# Patient Record
Sex: Female | Born: 1940 | Race: Black or African American | Hispanic: No | Marital: Married | Smoking: Former smoker
Health system: Southern US, Community
[De-identification: ages and names within clinical notes are randomized; demographics above are authoritative.]

## PROBLEM LIST (undated history)

## (undated) DIAGNOSIS — K219 Gastro-esophageal reflux disease without esophagitis: Secondary | ICD-10-CM

## (undated) DIAGNOSIS — J449 Chronic obstructive pulmonary disease, unspecified: Secondary | ICD-10-CM

## (undated) DIAGNOSIS — C801 Malignant (primary) neoplasm, unspecified: Secondary | ICD-10-CM

## (undated) DIAGNOSIS — I10 Essential (primary) hypertension: Secondary | ICD-10-CM

---

## 2008-12-31 ENCOUNTER — Emergency Department (HOSPITAL_BASED_OUTPATIENT_CLINIC_OR_DEPARTMENT_OTHER): Admission: EM | Admit: 2008-12-31 | Discharge: 2008-12-31 | Payer: Self-pay | Admitting: Emergency Medicine

## 2009-01-10 ENCOUNTER — Ambulatory Visit: Payer: Self-pay | Admitting: Diagnostic Radiology

## 2009-01-10 ENCOUNTER — Emergency Department (HOSPITAL_BASED_OUTPATIENT_CLINIC_OR_DEPARTMENT_OTHER): Admission: EM | Admit: 2009-01-10 | Discharge: 2009-01-10 | Payer: Self-pay | Admitting: Emergency Medicine

## 2010-12-26 LAB — POCT CARDIAC MARKERS
CKMB, poc: 3.4 ng/mL (ref 1.0–8.0)
Myoglobin, poc: 99.8 ng/mL (ref 12–200)

## 2010-12-26 LAB — GLUCOSE, CAPILLARY
Glucose-Capillary: 334 mg/dL — ABNORMAL HIGH (ref 70–99)
Glucose-Capillary: 405 mg/dL — ABNORMAL HIGH (ref 70–99)

## 2010-12-26 LAB — BASIC METABOLIC PANEL
BUN: 11 mg/dL (ref 6–23)
BUN: 5 mg/dL — ABNORMAL LOW (ref 6–23)
Creatinine, Ser: 0.7 mg/dL (ref 0.4–1.2)
Creatinine, Ser: 0.7 mg/dL (ref 0.4–1.2)
GFR calc non Af Amer: >60 mL/min (ref >60)
GFR calc non Af Amer: >60 mL/min (ref >60)
Potassium: 4.3 mEq/L (ref 3.5–5.1)

## 2010-12-26 LAB — CBC
Platelets: 216 10*3/uL (ref 150–400)
WBC: 4.8 10*3/uL (ref 4.0–10.5)

## 2010-12-26 LAB — URINE MICROSCOPIC-ADD ON

## 2010-12-26 LAB — URINALYSIS, ROUTINE W REFLEX MICROSCOPIC
Bilirubin Urine: NEGATIVE
Glucose, UA: >1000 mg/dL — AB
Hgb urine dipstick: NEGATIVE
Protein, ur: NEGATIVE mg/dL

## 2010-12-26 LAB — POCT B-TYPE NATRIURETIC PEPTIDE (BNP): B Natriuretic Peptide, POC: 49.1 pg/mL (ref 0–100)

## 2011-01-22 ENCOUNTER — Emergency Department (HOSPITAL_BASED_OUTPATIENT_CLINIC_OR_DEPARTMENT_OTHER)
Admission: EM | Admit: 2011-01-22 | Discharge: 2011-01-22 | Disposition: A | Discharge status: Home / Self Care | Payer: MEDICARE | Attending: Emergency Medicine | Admitting: Emergency Medicine

## 2011-01-22 ENCOUNTER — Emergency Department (INDEPENDENT_AMBULATORY_CARE_PROVIDER_SITE_OTHER): Payer: MEDICARE

## 2011-01-22 DIAGNOSIS — J45909 Unspecified asthma, uncomplicated: Secondary | ICD-10-CM | POA: Insufficient documentation

## 2011-01-22 DIAGNOSIS — M25559 Pain in unspecified hip: Secondary | ICD-10-CM | POA: Insufficient documentation

## 2011-01-22 DIAGNOSIS — E119 Type 2 diabetes mellitus without complications: Secondary | ICD-10-CM | POA: Insufficient documentation

## 2011-01-22 DIAGNOSIS — M169 Osteoarthritis of hip, unspecified: Secondary | ICD-10-CM | POA: Insufficient documentation

## 2011-01-22 DIAGNOSIS — M1610 Unilateral primary osteoarthritis, unspecified hip: Secondary | ICD-10-CM | POA: Insufficient documentation

## 2011-01-22 DIAGNOSIS — F17200 Nicotine dependence, unspecified, uncomplicated: Secondary | ICD-10-CM | POA: Insufficient documentation

## 2011-01-22 DIAGNOSIS — I10 Essential (primary) hypertension: Secondary | ICD-10-CM | POA: Insufficient documentation

## 2011-11-13 ENCOUNTER — Emergency Department (HOSPITAL_BASED_OUTPATIENT_CLINIC_OR_DEPARTMENT_OTHER)
Admission: EM | Admit: 2011-11-13 | Discharge: 2011-11-13 | Disposition: A | Discharge status: Home / Self Care | Payer: Medicare HMO | Attending: Emergency Medicine | Admitting: Emergency Medicine

## 2011-11-13 ENCOUNTER — Encounter (HOSPITAL_BASED_OUTPATIENT_CLINIC_OR_DEPARTMENT_OTHER): Payer: Self-pay | Admitting: Family Medicine

## 2011-11-13 ENCOUNTER — Emergency Department (INDEPENDENT_AMBULATORY_CARE_PROVIDER_SITE_OTHER): Payer: Medicare HMO

## 2011-11-13 DIAGNOSIS — I10 Essential (primary) hypertension: Secondary | ICD-10-CM | POA: Insufficient documentation

## 2011-11-13 DIAGNOSIS — M25539 Pain in unspecified wrist: Secondary | ICD-10-CM

## 2011-11-13 DIAGNOSIS — M654 Radial styloid tenosynovitis [de Quervain]: Secondary | ICD-10-CM | POA: Insufficient documentation

## 2011-11-13 DIAGNOSIS — Z79899 Other long term (current) drug therapy: Secondary | ICD-10-CM | POA: Insufficient documentation

## 2011-11-13 DIAGNOSIS — E119 Type 2 diabetes mellitus without complications: Secondary | ICD-10-CM | POA: Insufficient documentation

## 2011-11-13 HISTORY — DX: Essential (primary) hypertension: I10

## 2011-11-13 MED ORDER — HYDROCODONE-ACETAMINOPHEN 5-325 MG PO TABS
2.0000 | ORAL_TABLET | Freq: Once | ORAL | Status: AC
  Administered 2011-11-13: 2 via ORAL
  Filled 2011-11-13: qty 2

## 2011-11-13 NOTE — ED Provider Notes (Signed)
History     CSN: [id number]  Arrival date & time 11/13/11  1229   First MD Initiated Contact with Patient 11/13/11 1256      Chief Complaint  Patient presents with  . Wrist Pain    (Consider location/radiation/quality/duration/timing/severity/associated sxs/prior treatment) HPI Comments: Patient presents with pain to her left wrist along the thumb side of the the last week, gradually started getting worse since then. Denies any new injuries. Denies any repetitive hand motions denies any numbness or tingling to her hand. She's been taking some over-the-counter medicines, as well as some pain pills, and she says the pain is getting worse  Patient is a 71 y.o. female presenting with wrist pain. The history is provided by the patient.  Wrist Pain This is a new problem.    Past Medical History  Diagnosis Date  . Diabetes mellitus   . Hypertension     History reviewed. No pertinent past surgical history.  No family history on file.  History  Substance Use Topics  . Smoking status: Former Games developer  . Smokeless tobacco: Not on file  . Alcohol Use: No    OB History    Grav Para Term Preterm Abortions TAB SAB Ect Mult Living                  Review of Systems  Constitutional: Negative for fever.  Musculoskeletal: Positive for joint swelling.  Skin: Negative for rash and wound.  Neurological: Negative for weakness and numbness.    Allergies  Review of patient's allergies indicates no known allergies.  Home Medications   Current Outpatient Rx  Name Route Sig Dispense Refill  . CARVEDILOL 25 MG PO TABS Oral Take 25 mg by mouth 2 (two) times daily with a meal.    . GLIMEPIRIDE 4 MG PO TABS Oral Take 4 mg by mouth daily before breakfast.    . HYDROCODONE-ACETAMINOPHEN 5-325 MG PO TABS Oral Take 1 tablet by mouth every 6 (six) hours as needed.    Marland Kitchen LOSARTAN POTASSIUM-HCTZ 100-12.5 MG PO TABS Oral Take 1 tablet by mouth daily.    . OLOPATADINE HCL 0.2 % OP SOLN Ophthalmic  Apply to eye.    Marland Kitchen OMEPRAZOLE 40 MG PO CPDR Oral Take 40 mg by mouth daily.    Marland Kitchen SIMVASTATIN 10 MG PO TABS Oral Take 10 mg by mouth at bedtime.    . SUCRALFATE 1 G PO TABS Oral Take 1 g by mouth 4 (four) times daily.    . THEOPHYLLINE ER 200 MG PO CP24 Oral Take 200 mg by mouth daily.      BP 121/76  Pulse 67  Temp(Src) 97.7 F (36.5 C) (Oral)  Resp 16  Ht 4\' 11"  (1.499 m)  Wt 189 lb (85.73 kg)  BMI 38.17 kg/m2  SpO2 100%  Physical Exam  Constitutional: She appears well-developed and well-nourished.  Musculoskeletal:       Mild swelling and tenderness over her the left aspect of the wrist along the extensor tendon of the thumb. There is no warmth or erythema noted. She has some slight tenderness over the snuffbox, but it seems to be primarily over the extensor tendon. No deformity is noted. There is no other pain in the wrist. No pain in the elbow. No other bony tenderness. The hand is noted. She has normal sensation and normal motor function in the hand,  radial pulses 2+ bilaterally    ED Course  Procedures (including critical care time) Dg Wrist Complete Left  11/13/2011  *RADIOLOGY REPORT*  Clinical Data: Left wrist pain.  LEFT WRIST - COMPLETE 3+ VIEW  Comparison: None.  Findings: No acute fracture or dislocation identified.  No evidence of bony lesion or significant arthropathy.  Mild proliferative changes are seen at the level of the radial styloid.  Soft tissues are unremarkable.  IMPRESSION: Unremarkable left wrist with mild radial styloid proliferative changes.  Original Report Authenticated By: Reola Calkins, M.D.       1. Tenosynovitis, de Quervain       MDM  Pt symptoms seem consistent with dequervain's tenosynovitis.  Will splint, f/u with PMD as needed.  Pt has hydrocodone at home.        Rolan Bucco, MD 11/13/11 1440

## 2011-11-13 NOTE — Discharge Instructions (Signed)
De Quervain's Tenosynovitis De Quervain's tenosynovitis involves inflammation of one or two tendon linings (sheaths) or strain of one or two tendons to the thumb: extensor pollicis brevis (EPB), or abductor pollicis longus (APL). This causes pain on the side of the wrist and base of the thumb. Tendon sheaths secrete a fluid that lubricates the tendon, allowing the tendon to move smoothly. When the sheath becomes inflamed, the tendon cannot move freely in the sheath. Both the EPB and APL tendons are important for proper use of the hand. The EPB tendon is important for straightening the thumb. The APL tendon is important for moving the thumb away from the index finger (abducting). The two tendons pass through a small tube (canal) in the wrist, near the base of the thumb. When the tendons become inflamed, pain is usually felt in this area. SYMPTOMS   Pain, tenderness, swelling, warmth, or redness over the base of the thumb and thumb side of the wrist.   Pain that gets worse when straightening the thumb.   Pain that gets worse when moving the thumb away from the index finger, against resistance.   Pain with pinching or gripping.   Locking or catching of the thumb.   Limited motion of the thumb.   Crackling sound (crepitation) when the tendon or thumb is moved or touched.   Fluid-filled cyst in the area of the base of the thumb.  CAUSES   Tenosynovitis is often linked with overuse of the wrist.   Tenosynovitis may be caused by repeated injury to the thumb muscle and tendon units, and with repeated motions of the hand and wrist, due to friction of the tendon within the lining (sheath).   Tenosynovitis may also be due to a sudden increase in activity or change in activity.  RISK INCREASES WITH:  Sports that involve repeated hand and wrist motions (golf, bowling, tennis, squash, racquetball).   Heavy labor.   Poor physical wrist strength and flexibility.   Failure to warm up properly before  practice or play.   Female gender.   New mothers who hold their baby's head for long periods or lift infants with thumbs in the infant's armpit (axilla).  PREVENTION  Warm up and stretch properly before practice or competition.   Allow enough time for rest and recovery between practices and competition.   Maintain appropriate conditioning:   Cardiovascular fitness.   Forearm, wrist, and hand flexibility.   Muscle strength and endurance.   Use proper exercise technique.  PROGNOSIS  This condition is usually curable within 6 weeks, if treated properly with non-surgical treatment and resting of the affected area.  RELATED COMPLICATIONS   Longer healing time if not properly treated or if not given enough time to heal.   Chronic inflammation, causing recurring symptoms of tenosynovitis. Permanent pain or restriction of movement.   Risks of surgery: infection, bleeding, injury to nerves (numbness of the thumb), continued pain, incomplete release of the tendon sheath, recurring symptoms, cutting of the tendons, tendons sliding out of position, weakness of the thumb, thumb stiffness.  TREATMENT  First, treatment involves the use of medicine and ice, to reduce pain and inflammation. Patients are encouraged to stop or modify activities that aggravate the injury. Stretching and strengthening exercises may be advised. Exercises may be completed at home or with a therapist. You may be fitted with a brace or splint, to limit motion and allow the injury to heal. Your caregiver may also choose to give you a corticosteroid injection, to   reduce the pain and inflammation. If non-surgical treatment is not successful, surgery may be needed. Most tenosynovitis surgeries are done as outpatient procedures (you go home the same day). Surgery may involve local, regional (whole arm), or general anesthesia.  MEDICATION   If pain medicine is needed, nonsteroidal anti-inflammatory medicines (aspirin and  ibuprofen), or other minor pain relievers (acetaminophen), are often advised.   Do not take pain medicine for 7 days before surgery.   Prescription pain relievers are often prescribed only after surgery. Use only as directed and only as much as you need.   Corticosteroid injections may be given if your caregiver thinks they are needed. There is a limited number of times these injections may be given.  COLD THERAPY   Cold treatment (icing) should be applied for 10 to 15 minutes every 2 to 3 hours for inflammation and pain, and immediately after activity that aggravates your symptoms. Use ice packs or an ice massage.  SEEK MEDICAL CARE IF:   Symptoms get worse or do not improve in 2 to 4 weeks, despite treatment.   You experience pain, numbness, or coldness in the hand.   Blue, gray, or dark color appears in the fingernails.   Any of the following occur after surgery: increased pain, swelling, redness, drainage of fluids, bleeding in the affected area, or signs of infection.   New, unexplained symptoms develop. (Drugs used in treatment may produce side effects.)  Document Released: 09/02/2005 Document Revised: 05/15/2011 Document Reviewed: 12/15/2008 [facility] Patient Information 2012 [place], [place].

## 2011-11-13 NOTE — ED Notes (Signed)
Pt c/o left wrist pain x 1 wk, denies injury.

## 2012-01-16 ENCOUNTER — Emergency Department (INDEPENDENT_AMBULATORY_CARE_PROVIDER_SITE_OTHER): Payer: Medicare HMO

## 2012-01-16 ENCOUNTER — Encounter (HOSPITAL_BASED_OUTPATIENT_CLINIC_OR_DEPARTMENT_OTHER): Payer: Self-pay | Admitting: Emergency Medicine

## 2012-01-16 ENCOUNTER — Emergency Department (HOSPITAL_BASED_OUTPATIENT_CLINIC_OR_DEPARTMENT_OTHER)
Admission: EM | Admit: 2012-01-16 | Discharge: 2012-01-16 | Disposition: A | Discharge status: Home / Self Care | Payer: Medicare HMO | Attending: Emergency Medicine | Admitting: Emergency Medicine

## 2012-01-16 DIAGNOSIS — I10 Essential (primary) hypertension: Secondary | ICD-10-CM | POA: Insufficient documentation

## 2012-01-16 DIAGNOSIS — M129 Arthropathy, unspecified: Secondary | ICD-10-CM

## 2012-01-16 DIAGNOSIS — M25569 Pain in unspecified knee: Secondary | ICD-10-CM

## 2012-01-16 DIAGNOSIS — E119 Type 2 diabetes mellitus without complications: Secondary | ICD-10-CM | POA: Insufficient documentation

## 2012-01-16 DIAGNOSIS — M25561 Pain in right knee: Secondary | ICD-10-CM

## 2012-01-16 DIAGNOSIS — M1710 Unilateral primary osteoarthritis, unspecified knee: Secondary | ICD-10-CM | POA: Insufficient documentation

## 2012-01-16 NOTE — Discharge Instructions (Signed)
Knee Pain The knee is the complex joint between your thigh and your lower leg. It is made up of bones, tendons, ligaments, and cartilage. The bones that make up the knee are:  The femur in the thigh.   The tibia and fibula in the lower leg.   The patella or kneecap riding in the groove on the lower femur.  CAUSES  Knee pain is a common complaint with many causes. A few of these causes are:  Injury, such as:   A ruptured ligament or tendon injury.   Torn cartilage.   Medical conditions, such as:   Gout   Arthritis   Infections   Overuse, over training or overdoing a physical activity.  Knee pain can be minor or severe. Knee pain can accompany debilitating injury. Minor knee problems often respond well to self-care measures or get well on their own. More serious injuries may need medical intervention or even surgery. SYMPTOMS The knee is complex. Symptoms of knee problems can vary widely. Some of the problems are:  Pain with movement and weight bearing.   Swelling and tenderness.   Buckling of the knee.   Inability to straighten or extend your knee.   Your knee locks and you cannot straighten it.   Warmth and redness with pain and fever.   Deformity or dislocation of the kneecap.  DIAGNOSIS  Determining what is wrong may be very straight forward such as when there is an injury. It can also be challenging because of the complexity of the knee. Tests to make a diagnosis may include:  Your caregiver taking a history and doing a physical exam.   Routine X-rays can be used to rule out other problems. X-rays will not reveal a cartilage tear. Some injuries of the knee can be diagnosed by:   Arthroscopy a surgical technique by which a small video camera is inserted through tiny incisions on the sides of the knee. This procedure is used to examine and repair internal knee joint problems. Tiny instruments can be used during arthroscopy to repair the torn knee cartilage  (meniscus).   Arthrography is a radiology technique. A contrast liquid is directly injected into the knee joint. Internal structures of the knee joint then become visible on X-ray film.   An MRI scan is a non x-ray radiology procedure in which magnetic fields and a computer produce two- or three-dimensional images of the inside of the knee. Cartilage tears are often visible using an MRI scanner. MRI scans have largely replaced arthrography in diagnosing cartilage tears of the knee.   Blood work.   Examination of the fluid that helps to lubricate the knee joint (synovial fluid). This is done by taking a sample out using a needle and a syringe.  TREATMENT The treatment of knee problems depends on the cause. Some of these treatments are:  Depending on the injury, proper casting, splinting, surgery or physical therapy care will be needed.   Give yourself adequate recovery time. Do not overuse your joints. If you begin to get sore during workout routines, back off. Slow down or do fewer repetitions.   For repetitive activities such as cycling or running, maintain your strength and nutrition.   Alternate muscle groups. For example if you are a weight lifter, work the upper body on one day and the lower body the next.   Either tight or weak muscles do not give the proper support for your knee. Tight or weak muscles do not absorb the stress placed   on the knee joint. Keep the muscles surrounding the knee strong.   Take care of mechanical problems.   If you have flat feet, orthotics or special shoes may help. See your caregiver if you need help.   Arch supports, sometimes with wedges on the inner or outer aspect of the heel, can help. These can shift pressure away from the side of the knee most bothered by osteoarthritis.   A brace called an "unloader" brace also may be used to help ease the pressure on the most arthritic side of the knee.   If your caregiver has prescribed crutches, braces,  wraps or ice, use as directed. The acronym for this is PRICE. This means protection, rest, ice, compression and elevation.   Nonsteroidal anti-inflammatory drugs (NSAID's), can help relieve pain. But if taken immediately after an injury, they may actually increase swelling. Take NSAID's with food in your stomach. Stop them if you develop stomach problems. Do not take these if you have a history of ulcers, stomach pain or bleeding from the bowel. Do not take without your caregiver's approval if you have problems with fluid retention, heart failure, or kidney problems.   For ongoing knee problems, physical therapy may be helpful.   Glucosamine and chondroitin are over-the-counter dietary supplements. Both may help relieve the pain of osteoarthritis in the knee. These medicines are different from the usual anti-inflammatory drugs. Glucosamine may decrease the rate of cartilage destruction.   Injections of a corticosteroid drug into your knee joint may help reduce the symptoms of an arthritis flare-up. They may provide pain relief that lasts a few months. You may have to wait a few months between injections. The injections do have a small increased risk of infection, water retention and elevated blood sugar levels.   Hyaluronic acid injected into damaged joints may ease pain and provide lubrication. These injections may work by reducing inflammation. A series of shots may give relief for as long as 6 months.   Topical painkillers. Applying certain ointments to your skin may help relieve the pain and stiffness of osteoarthritis. Ask your pharmacist for suggestions. Many over the-counter products are approved for temporary relief of arthritis pain.   In some countries, doctors often prescribe topical NSAID's for relief of chronic conditions such as arthritis and tendinitis. A review of treatment with NSAID creams found that they worked as well as oral medications but without the serious side effects.    PREVENTION  Maintain a healthy weight. Extra pounds put more strain on your joints.   Get strong, stay limber. Weak muscles are a common cause of knee injuries. Stretching is important. Include flexibility exercises in your workouts.   Be smart about exercise. If you have osteoarthritis, chronic knee pain or recurring injuries, you may need to change the way you exercise. This does not mean you have to stop being active. If your knees ache after jogging or playing basketball, consider switching to swimming, water aerobics or other low-impact activities, at least for a few days a week. Sometimes limiting high-impact activities will provide relief.   Make sure your shoes fit well. Choose footwear that is right for your sport.   Protect your knees. Use the proper gear for knee-sensitive activities. Use kneepads when playing volleyball or laying carpet. Buckle your seat belt every time you drive. Most shattered kneecaps occur in car accidents.   Rest when you are tired.  SEEK MEDICAL CARE IF:  You have knee pain that is continual and does not   seem to be getting better.  SEEK IMMEDIATE MEDICAL CARE IF:  Your knee joint feels hot to the touch and you have a high fever. MAKE SURE YOU:   Understand these instructions.   Will watch your condition.   Will get help right away if you are not doing well or get worse.  Document Released: 06/30/2007 Document Revised: 08/22/2011 Document Reviewed: 06/30/2007 [employer] Patient Information 2012 [employer]. 

## 2012-01-16 NOTE — ED Notes (Signed)
States she has a bad knee but past 2 days it feels "frozen and unable to bend it"

## 2012-01-16 NOTE — ED Provider Notes (Signed)
History     CSN: [id number]  Arrival date & time 01/16/12  4098   First MD Initiated Contact with Patient 01/16/12 1753      Chief Complaint  Patient presents with  . Knee Pain    HPI The patient presents with knee pain.  She notes that she has a long history of bilateral knee pain, attributed to degenerative joint disease.  She has been scheduled to see orthopedists for definitive care, but not has not yet done so.  She notes that since yesterday, without a clear precipitant she has had increasing pain in her right knee.  The pain is focally about the right superior medial aspect, though it is diffuse.  The pain is described as sharp, worse with any motion.  Patient has a prescription for Norco for chronic pain, and has attempted to address this pain event with a single dose.  She has not taken any other additional medication, nor applied any ice packs.  She denies any distal dysesthesia or weakness.  She also denies any new fevers, chills, nausea, vomiting, chest pain, dyspnea.  She states that the knee does not look appreciably different area Past Medical History  Diagnosis Date  . Diabetes mellitus   . Hypertension     History reviewed. No pertinent past surgical history.  No family history on file.  History  Substance Use Topics  . Smoking status: Former Games developer  . Smokeless tobacco: Not on file  . Alcohol Use: No    OB History    Grav Para Term Preterm Abortions TAB SAB Ect Mult Living                  Review of Systems  All other systems reviewed and are negative.    Allergies  Review of patient's allergies indicates no known allergies.  Home Medications   Current Outpatient Rx  Name Route Sig Dispense Refill  . CARVEDILOL 25 MG PO TABS Oral Take 25 mg by mouth 2 (two) times daily with a meal.    . GLIMEPIRIDE 4 MG PO TABS Oral Take 4 mg by mouth daily before breakfast.    . HYDROCODONE-ACETAMINOPHEN 5-325 MG PO TABS Oral Take 1 tablet by mouth every 6 (six)  hours as needed.    Marland Kitchen LOSARTAN POTASSIUM-HCTZ 100-12.5 MG PO TABS Oral Take 1 tablet by mouth daily.    . OLOPATADINE HCL 0.2 % OP SOLN Ophthalmic Apply to eye.    Marland Kitchen OMEPRAZOLE 40 MG PO CPDR Oral Take 40 mg by mouth daily.    Marland Kitchen SIMVASTATIN 10 MG PO TABS Oral Take 10 mg by mouth at bedtime.    . SUCRALFATE 1 G PO TABS Oral Take 1 g by mouth 4 (four) times daily.    . THEOPHYLLINE ER 200 MG PO CP24 Oral Take 200 mg by mouth daily.      BP 147/71  Pulse 64  Temp(Src) 98.5 F (36.9 C) (Oral)  Resp 16  Ht 4\' 9"  (1.448 m)  Wt 188 lb (85.276 kg)  BMI 40.68 kg/m2  SpO2 100%  Physical Exam  Nursing note and vitals reviewed. Constitutional: She appears well-developed and well-nourished. No distress.  HENT:  Head: Normocephalic and atraumatic.  Eyes: Conjunctivae and EOM are normal.  Cardiovascular: Normal rate, regular rhythm and intact distal pulses.   Murmur heard. Pulmonary/Chest: Effort normal and breath sounds normal. No stridor. No respiratory distress.  Musculoskeletal:       Right hip: Normal.  Right knee: She exhibits decreased range of motion and effusion. She exhibits no swelling, no ecchymosis, no deformity, no laceration, no erythema, normal alignment, no LCL laxity and normal patellar mobility.       Left knee: She exhibits decreased range of motion. She exhibits no swelling, no effusion, no ecchymosis and no deformity.       Right ankle: Normal.    ED Course  Procedures (including critical care time)  Labs Reviewed - No data to display Dg Knee Complete 4 Views Right  01/16/2012  *RADIOLOGY REPORT*  Clinical Data: Right anterior knee pain for months, no known injury, history arthritis  RIGHT KNEE - COMPLETE 4+ VIEW  Comparison: None  Findings: Osseous demineralization. Osteoarthritic changes right knee more pronounced at the medial compartment and patellofemoral joint. No acute fracture, dislocation, or bone destruction. Question small knee joint effusion.  IMPRESSION:  Tricompartmental osteoarthritic changes right knee.  Original Report Authenticated By: Lollie Marrow, M.D.     1. Knee pain, right       MDM  This elderly female with chronic knee pain attributed to degenerative joint disease now presents with acute exacerbation of pain in her right knee.  On exam she is in no distress, though she is tenderness to palpation about the medial joint line with a small effusion.  The absence of any superficial changes, fevers, chills, nausea, vomiting, diarrhea is reassuring for the low suspicion of septic joint.  Patient has no history of gout.  The patient's x-rays suggest progression of degenerative joint disease.  I discussed the findings with the patient and her daughter.  She is discharged in stable condition to followup with orthopedist    Gerhard Munch, MD 01/16/12 [id number]

## 2012-01-16 NOTE — ED Notes (Signed)
Patient transported to X-ray 

## 2012-04-09 ENCOUNTER — Encounter (HOSPITAL_BASED_OUTPATIENT_CLINIC_OR_DEPARTMENT_OTHER): Payer: Self-pay

## 2012-04-09 ENCOUNTER — Emergency Department (HOSPITAL_BASED_OUTPATIENT_CLINIC_OR_DEPARTMENT_OTHER): Payer: Medicare HMO

## 2012-04-09 ENCOUNTER — Emergency Department (HOSPITAL_BASED_OUTPATIENT_CLINIC_OR_DEPARTMENT_OTHER)
Admission: EM | Admit: 2012-04-09 | Discharge: 2012-04-09 | Disposition: A | Discharge status: Home / Self Care | Payer: Medicare HMO | Attending: Emergency Medicine | Admitting: Emergency Medicine

## 2012-04-09 DIAGNOSIS — I10 Essential (primary) hypertension: Secondary | ICD-10-CM | POA: Insufficient documentation

## 2012-04-09 DIAGNOSIS — E119 Type 2 diabetes mellitus without complications: Secondary | ICD-10-CM | POA: Insufficient documentation

## 2012-04-09 DIAGNOSIS — R109 Unspecified abdominal pain: Secondary | ICD-10-CM

## 2012-04-09 DIAGNOSIS — J449 Chronic obstructive pulmonary disease, unspecified: Secondary | ICD-10-CM | POA: Insufficient documentation

## 2012-04-09 DIAGNOSIS — J4489 Other specified chronic obstructive pulmonary disease: Secondary | ICD-10-CM | POA: Insufficient documentation

## 2012-04-09 DIAGNOSIS — Z87891 Personal history of nicotine dependence: Secondary | ICD-10-CM | POA: Insufficient documentation

## 2012-04-09 DIAGNOSIS — K219 Gastro-esophageal reflux disease without esophagitis: Secondary | ICD-10-CM | POA: Insufficient documentation

## 2012-04-09 DIAGNOSIS — R1032 Left lower quadrant pain: Secondary | ICD-10-CM | POA: Insufficient documentation

## 2012-04-09 DIAGNOSIS — Z79899 Other long term (current) drug therapy: Secondary | ICD-10-CM | POA: Insufficient documentation

## 2012-04-09 HISTORY — DX: Chronic obstructive pulmonary disease, unspecified: J44.9

## 2012-04-09 HISTORY — DX: Gastro-esophageal reflux disease without esophagitis: K21.9

## 2012-04-09 LAB — COMPREHENSIVE METABOLIC PANEL
ALT: 9 U/L (ref 0–35)
Calcium: 9.8 mg/dL (ref 8.4–10.5)
Creatinine, Ser: 1 mg/dL (ref 0.50–1.10)
GFR calc Af Amer: 64 mL/min — ABNORMAL LOW (ref >90)
Glucose, Bld: 108 mg/dL — ABNORMAL HIGH (ref 70–99)
Sodium: 137 mEq/L (ref 135–145)
Total Protein: 8.5 g/dL — ABNORMAL HIGH (ref 6.0–8.3)

## 2012-04-09 LAB — CBC WITH DIFFERENTIAL/PLATELET
Eosinophils Absolute: 0.1 10*3/uL (ref 0.0–0.7)
Eosinophils Relative: 2 % (ref 0–5)
Lymphs Abs: 2 10*3/uL (ref 0.7–4.0)
MCH: 28.9 pg (ref 26.0–34.0)
MCV: 85.4 fL (ref 78.0–100.0)
Platelets: 229 10*3/uL (ref 150–400)
RBC: 4.05 MIL/uL (ref 3.87–5.11)
RDW: 13.9 % (ref 11.5–15.5)

## 2012-04-09 LAB — URINALYSIS, ROUTINE W REFLEX MICROSCOPIC
Bilirubin Urine: NEGATIVE
Hgb urine dipstick: NEGATIVE
Protein, ur: NEGATIVE mg/dL
Urobilinogen, UA: 0.2 mg/dL (ref 0.0–1.0)

## 2012-04-09 MED ORDER — IOHEXOL 300 MG/ML  SOLN
100.0000 mL | Freq: Once | INTRAMUSCULAR | Status: AC | PRN
  Administered 2012-04-09: 100 mL via INTRAVENOUS

## 2012-04-09 MED ORDER — IOHEXOL 300 MG/ML  SOLN
36.0000 mL | Freq: Once | INTRAMUSCULAR | Status: AC | PRN
  Administered 2012-04-09: 36 mL via ORAL

## 2012-04-09 NOTE — ED Notes (Signed)
Pt reports LLQ pain that started "months" ago.  She also reports urinary frequency.

## 2012-04-09 NOTE — ED Provider Notes (Signed)
History     CSN: [id number]  Arrival date & time 04/09/12  1307   First MD Initiated Contact with Patient 04/09/12 1340      Chief Complaint  Patient presents with  . Abdominal Pain    (Consider location/radiation/quality/duration/timing/severity/associated sxs/prior treatment) HPI Comments: Pt c/o llq pain started about 1 month ago:pt states that she came in today because the pain is worsening  Patient is a 71 y.o. female presenting with abdominal pain. The history is provided by the patient. No language interpreter was used.  Abdominal Pain The primary symptoms of the illness include abdominal pain. The primary symptoms of the illness do not include fever, nausea, vomiting, diarrhea, vaginal discharge or vaginal bleeding. The current episode started more than 2 days ago. The onset of the illness was gradual. The problem has not changed since onset.   Past Medical History  Diagnosis Date  . Diabetes mellitus   . Hypertension   . GERD (gastroesophageal reflux disease)   . COPD (chronic obstructive pulmonary disease)     Past Surgical History  Procedure Date  . Cesarean section     No family history on file.  History  Substance Use Topics  . Smoking status: Former Games developer  . Smokeless tobacco: Not on file  . Alcohol Use: No    OB History    Grav Para Term Preterm Abortions TAB SAB Ect Mult Living                  Review of Systems  Constitutional: Negative for fever.  Respiratory: Negative.   Cardiovascular: Negative.   Gastrointestinal: Positive for abdominal pain. Negative for nausea, vomiting and diarrhea.  Genitourinary: Negative for vaginal bleeding and vaginal discharge.    Allergies  Review of patient's allergies indicates no known allergies.  Home Medications   Current Outpatient Rx  Name Route Sig Dispense Refill  . CARVEDILOL 25 MG PO TABS Oral Take 25 mg by mouth 2 (two) times daily with a meal.    . GLIMEPIRIDE 4 MG PO TABS Oral Take 4 mg by  mouth daily before breakfast.    . HYDROCODONE-ACETAMINOPHEN 5-325 MG PO TABS Oral Take 1 tablet by mouth every 6 (six) hours as needed.    Marland Kitchen LOSARTAN POTASSIUM-HCTZ 100-12.5 MG PO TABS Oral Take 1 tablet by mouth daily.    . OLOPATADINE HCL 0.2 % OP SOLN Ophthalmic Apply to eye.    Marland Kitchen OMEPRAZOLE 40 MG PO CPDR Oral Take 40 mg by mouth daily.    Marland Kitchen SIMVASTATIN 10 MG PO TABS Oral Take 10 mg by mouth at bedtime.    . SUCRALFATE 1 G PO TABS Oral Take 1 g by mouth 4 (four) times daily.    . THEOPHYLLINE ER 200 MG PO CP24 Oral Take 200 mg by mouth daily.      BP 136/76  Pulse 78  Temp 97.6 F (36.4 C) (Oral)  Resp 16  Ht 5' (1.524 m)  Wt 180 lb (81.647 kg)  BMI 35.15 kg/m2  SpO2 100%  Physical Exam  Nursing note and vitals reviewed. Constitutional: She is oriented to person, place, and time. She appears well-developed and well-nourished.  HENT:  Head: Normocephalic and atraumatic.  Eyes: Conjunctivae and EOM are normal.  Neck: Neck supple.  Cardiovascular: Normal rate and regular rhythm.   Pulmonary/Chest: Effort normal and breath sounds normal.  Abdominal: Soft. Bowel sounds are normal. There is tenderness in the left lower quadrant.  Musculoskeletal: Normal range of motion.  Neurological:  She is alert and oriented to person, place, and time.  Skin: Skin is warm and dry.    ED Course  Procedures (including critical care time)  Labs Reviewed  URINALYSIS, ROUTINE W REFLEX MICROSCOPIC - Abnormal; Notable for the following:    APPearance CLOUDY (*)     All other components within normal limits  CBC WITH DIFFERENTIAL - Abnormal; Notable for the following:    Hemoglobin 11.7 (*)     HCT 34.6 (*)     All other components within normal limits  COMPREHENSIVE METABOLIC PANEL - Abnormal; Notable for the following:    Glucose, Bld 108 (*)     Total Protein 8.5 (*)     GFR calc non Af Amer 55 (*)     GFR calc Af Amer 64 (*)     All other components within normal limits   Ct Abdomen  Pelvis W Contrast  04/09/2012  *RADIOLOGY REPORT*  Clinical Data: Abdominal pain  CT ABDOMEN AND PELVIS WITH CONTRAST  Technique:  Multidetector CT imaging of the abdomen and pelvis was performed following the standard protocol during bolus administration of intravenous contrast.  Contrast: 36mL OMNIPAQUE IOHEXOL 300 MG/ML  SOLN, OMNIPAQUE IOHEXOL 300 MG/ML  SOLN  Comparison: None.  Findings: Lung bases are clear.  No pericardial fluid.  No focal hepatic lesion.  The gallbladder, pancreas, and spleen, adrenal glands, and kidneys are normal. There is a nonobstructing right renal calculus.  The stomach, small bowel and appendix are normal.  The colon and the rectosigmoid colon are normal.  Abdominal aorta normal caliber.  No retroperitoneal or periportal lymphadenopathy.  No free fluid the abdomen or pelvis.  No abscess.  The bladder and uterus and ovaries are normal.  No pelvic lymphadenopathy. Review of  bone windows demonstrates no aggressive osseous lesions.  IMPRESSION:  1.  No acute abdominal or pelvic findings. 2.  Normal appendix.  Original Report Authenticated By: Genevive Bi, M.D.     1. Abdominal pain       MDM  No acute findings:pt denies need for any new pain medication:pt to follow up with pcp for continued symptoms        Teressa Lower, NP 04/09/12 1629

## 2012-04-10 NOTE — ED Provider Notes (Signed)
Medical screening examination/treatment/procedure(s) were performed by non-physician practitioner and as supervising physician I was immediately available for consultation/collaboration.  Geoffery Lyons, MD 04/10/12 [phone]

## 2012-08-28 ENCOUNTER — Encounter (HOSPITAL_BASED_OUTPATIENT_CLINIC_OR_DEPARTMENT_OTHER): Payer: Self-pay

## 2012-08-28 ENCOUNTER — Emergency Department (HOSPITAL_BASED_OUTPATIENT_CLINIC_OR_DEPARTMENT_OTHER): Payer: Medicare HMO

## 2012-08-28 ENCOUNTER — Emergency Department (HOSPITAL_BASED_OUTPATIENT_CLINIC_OR_DEPARTMENT_OTHER)
Admission: EM | Admit: 2012-08-28 | Discharge: 2012-08-28 | Disposition: A | Discharge status: Home / Self Care | Payer: Medicare HMO | Attending: Emergency Medicine | Admitting: Emergency Medicine

## 2012-08-28 DIAGNOSIS — J4489 Other specified chronic obstructive pulmonary disease: Secondary | ICD-10-CM | POA: Insufficient documentation

## 2012-08-28 DIAGNOSIS — I10 Essential (primary) hypertension: Secondary | ICD-10-CM | POA: Insufficient documentation

## 2012-08-28 DIAGNOSIS — R918 Other nonspecific abnormal finding of lung field: Secondary | ICD-10-CM

## 2012-08-28 DIAGNOSIS — E119 Type 2 diabetes mellitus without complications: Secondary | ICD-10-CM | POA: Insufficient documentation

## 2012-08-28 DIAGNOSIS — J449 Chronic obstructive pulmonary disease, unspecified: Secondary | ICD-10-CM | POA: Insufficient documentation

## 2012-08-28 DIAGNOSIS — Z87891 Personal history of nicotine dependence: Secondary | ICD-10-CM | POA: Insufficient documentation

## 2012-08-28 DIAGNOSIS — R0602 Shortness of breath: Secondary | ICD-10-CM | POA: Insufficient documentation

## 2012-08-28 DIAGNOSIS — E785 Hyperlipidemia, unspecified: Secondary | ICD-10-CM | POA: Insufficient documentation

## 2012-08-28 DIAGNOSIS — K219 Gastro-esophageal reflux disease without esophagitis: Secondary | ICD-10-CM | POA: Insufficient documentation

## 2012-08-28 DIAGNOSIS — E669 Obesity, unspecified: Secondary | ICD-10-CM | POA: Insufficient documentation

## 2012-08-28 DIAGNOSIS — Z79899 Other long term (current) drug therapy: Secondary | ICD-10-CM | POA: Insufficient documentation

## 2012-08-28 DIAGNOSIS — R222 Localized swelling, mass and lump, trunk: Secondary | ICD-10-CM | POA: Insufficient documentation

## 2012-08-28 LAB — COMPREHENSIVE METABOLIC PANEL
ALT: 14 U/L (ref 0–35)
Alkaline Phosphatase: 97 U/L (ref 39–117)
BUN: 9 mg/dL (ref 6–23)
CO2: 29 mEq/L (ref 19–32)
GFR calc Af Amer: >90 mL/min (ref >90)
GFR calc non Af Amer: 85 mL/min — ABNORMAL LOW (ref >90)
Glucose, Bld: 217 mg/dL — ABNORMAL HIGH (ref 70–99)
Potassium: 4.2 mEq/L (ref 3.5–5.1)
Sodium: 136 mEq/L (ref 135–145)
Total Protein: 8.3 g/dL (ref 6.0–8.3)

## 2012-08-28 LAB — CBC WITH DIFFERENTIAL/PLATELET
Eosinophils Absolute: 0.1 10*3/uL (ref 0.0–0.7)
Eosinophils Relative: 2 % (ref 0–5)
Hemoglobin: 11.3 g/dL — ABNORMAL LOW (ref 12.0–15.0)
Lymphocytes Relative: 35 % (ref 12–46)
Lymphs Abs: 1.8 10*3/uL (ref 0.7–4.0)
MCH: 29.3 pg (ref 26.0–34.0)
MCV: 84.2 fL (ref 78.0–100.0)
Monocytes Relative: 10 % (ref 3–12)
Neutrophils Relative %: 53 % (ref 43–77)
Platelets: 228 10*3/uL (ref 150–400)
RBC: 3.86 MIL/uL — ABNORMAL LOW (ref 3.87–5.11)
WBC: 5.2 10*3/uL (ref 4.0–10.5)

## 2012-08-28 LAB — LIPASE, BLOOD: Lipase: 25 U/L (ref 11–59)

## 2012-08-28 MED ORDER — ONDANSETRON HCL 4 MG/2ML IJ SOLN: 4.0000 mg | Freq: Once | INTRAMUSCULAR | Status: DC

## 2012-08-28 MED ORDER — MORPHINE SULFATE 4 MG/ML IJ SOLN: 4.0000 mg | Freq: Once | INTRAMUSCULAR | Status: DC

## 2012-08-28 MED ORDER — IOHEXOL 350 MG/ML SOLN
100.0000 mL | Freq: Once | INTRAVENOUS | Status: AC | PRN
  Administered 2012-08-28: 100 mL via INTRAVENOUS

## 2012-08-28 NOTE — ED Notes (Signed)
Was able to draw labs with IV attempt to right wrist-pt states she knows she is a hard IV stick and asked not to be stuck again at this time-is willing to wait to see if need for IV after labs-EDP notified and orders for vicodin if needed-pt refused pain med at this time-advised she would let me know if she changed her mind

## 2012-08-28 NOTE — ED Notes (Signed)
Patient IV infiltrated. Removed. Elevated and warm compress applied

## 2012-08-28 NOTE — ED Notes (Signed)
C/o central chest pain that radiates to back that started last night-states she had shob which is her baseline-states she takes neb tx and is on O2 prn

## 2012-08-28 NOTE — ED Notes (Signed)
EDP Plunkett to BS with BS US-attempted bilat AC IV start w/o success

## 2012-08-28 NOTE — ED Provider Notes (Addendum)
History     CSN: [id number]  Arrival date & time 08/28/12  1136   First MD Initiated Contact with Patient 08/28/12 1143      Chief Complaint  Patient presents with  . Chest Pain    (Consider location/radiation/quality/duration/timing/severity/associated sxs/prior treatment) Patient is a 71 y.o. female presenting with chest pain. The history is provided by the patient.  Chest Pain The chest pain began yesterday. Duration of episode(s) is 24 hours. Chest pain occurs constantly. The chest pain is unchanged. The pain is associated with breathing, lifting and coughing (certain positions). At its most intense, the pain is at 7/10. The pain is currently at 7/10. The severity of the pain is moderate. The quality of the pain is described as pleuritic, sharp and stabbing. Radiates to: in the lower sternum around to the upper back. Chest pain is worsened by certain positions and deep breathing. Primary symptoms include shortness of breath. Pertinent negatives for primary symptoms include no fever, no fatigue, no cough, no wheezing, no palpitations, no abdominal pain, no nausea and no vomiting.  Pertinent negatives for associated symptoms include no lower extremity edema and no weakness. She tried nothing for the symptoms. Risk factors include obesity and being elderly.  Her past medical history is significant for COPD, diabetes, hyperlipidemia and hypertension.  Pertinent negatives for past medical history include no CAD, no cancer, no MI and no PE.  Procedure history is positive for cardiac catheterization. Procedure history comments: Cardiac catheterization approximately 3-4 years ago that did not require stenting.     Past Medical History  Diagnosis Date  . Diabetes mellitus   . Hypertension   . GERD (gastroesophageal reflux disease)   . COPD (chronic obstructive pulmonary disease)     Past Surgical History  Procedure Date  . Cesarean section     No family history on file.  History    Substance Use Topics  . Smoking status: Former Games developer  . Smokeless tobacco: Not on file  . Alcohol Use: No    OB History    Grav Para Term Preterm Abortions TAB SAB Ect Mult Living                  Review of Systems  Constitutional: Negative for fever and fatigue.  Respiratory: Positive for shortness of breath. Negative for cough and wheezing.   Cardiovascular: Positive for chest pain. Negative for palpitations.  Gastrointestinal: Negative for nausea, vomiting and abdominal pain.  Neurological: Negative for weakness.  All other systems reviewed and are negative.    Allergies  Review of patient's allergies indicates no known allergies.  Home Medications   Current Outpatient Rx  Name  Route  Sig  Dispense  Refill  . CARVEDILOL 25 MG PO TABS   Oral   Take 25 mg by mouth 2 (two) times daily with a meal.         . GLIMEPIRIDE 4 MG PO TABS   Oral   Take 4 mg by mouth 2 (two) times daily.          Marland Kitchen HYDROCODONE-ACETAMINOPHEN 5-325 MG PO TABS   Oral   Take 1 tablet by mouth every 6 (six) hours as needed.         Marland Kitchen LOSARTAN POTASSIUM-HCTZ 100-12.5 MG PO TABS   Oral   Take 1 tablet by mouth daily.         . OLOPATADINE HCL 0.2 % OP SOLN   Both Eyes   Place 1 drop into both eyes  daily.          Marland Kitchen OMEPRAZOLE 40 MG PO CPDR   Oral   Take 40 mg by mouth daily.         Marland Kitchen SIMVASTATIN 10 MG PO TABS   Oral   Take 10 mg by mouth at bedtime.         . SUCRALFATE 1 G PO TABS   Oral   Take 1 g by mouth 3 (three) times daily.          . THEOPHYLLINE ER 200 MG PO CP24   Oral   Take 200 mg by mouth 2 (two) times daily.            BP 208/110  Pulse 70  Temp 98.5 F (36.9 C) (Oral)  Resp 20  Ht 4\' 11"  (1.499 m)  Wt 190 lb (86.183 kg)  BMI 38.38 kg/m2  SpO2 100%  Physical Exam  Nursing note and vitals reviewed. Constitutional: She is oriented to person, place, and time. She appears well-developed and well-nourished. No distress.  HENT:  Head:  Normocephalic and atraumatic.  Mouth/Throat: Oropharynx is clear and moist.  Eyes: Conjunctivae normal and EOM are normal. Pupils are equal, round, and reactive to light.  Neck: Normal range of motion. Neck supple.  Cardiovascular: Normal rate, regular rhythm and intact distal pulses.   No murmur heard. Pulmonary/Chest: Effort normal and breath sounds normal. No respiratory distress. She has no wheezes. She has no rales. She exhibits tenderness.    Abdominal: Soft. Normal appearance. She exhibits no distension. There is tenderness in the epigastric area. There is no rebound, no guarding and no CVA tenderness.  Musculoskeletal: Normal range of motion. She exhibits no edema and no tenderness.  Neurological: She is alert and oriented to person, place, and time.  Skin: Skin is warm and dry. No rash noted. No erythema.  Psychiatric: She has a normal mood and affect. Her behavior is normal.    ED Course  Procedures (including critical care time)  Labs Reviewed  CBC WITH DIFFERENTIAL - Abnormal; Notable for the following:    RBC 3.86 (*)     Hemoglobin 11.3 (*)     HCT 32.5 (*)     All other components within normal limits  COMPREHENSIVE METABOLIC PANEL - Abnormal; Notable for the following:    Glucose, Bld 217 (*)     GFR calc non Af Amer 85 (*)     All other components within normal limits  D-DIMER, QUANTITATIVE - Abnormal; Notable for the following:    D-Dimer, Quant 0.79 (*)     All other components within normal limits  LIPASE, BLOOD  TROPONIN I   Dg Chest 2 View  08/28/2012  *RADIOLOGY REPORT*  Clinical Data: Chest pain.  CHEST - 2 VIEW  Comparison: January 10, 2009.  Findings: Cardiomediastinal silhouette is within normal limits. Left lung is clear.  There is interval development of rounded density in the right perihilar region concerning for possible mass or nodule.  Otherwise right lung is clear.  IMPRESSION: Interval development of rounded density in right perihilar region  concerning for possible nodule or neoplasm; chest CT with contrast is recommended for further evaluation.   Original Report Authenticated By: Lupita Raider.,  M.D.    Ct Angio Chest Pe W/cm &/or Wo Cm  08/28/2012  *RADIOLOGY REPORT*  Clinical Data: Chest pain, shortness of breath, elevated D-dimer  CT ANGIOGRAPHY CHEST  Technique:  Multidetector CT imaging of the chest using the  standard protocol during bolus administration of intravenous contrast. Multiplanar reconstructed images including MIPs were obtained and reviewed to evaluate the vascular anatomy.  Contrast: OMNIPAQUE IOHEXOL 350 MG/ML SOLN  Comparison: Chest radiograph dated 08/28/2012  Findings: No evidence of pulmonary embolism.  1.7 x 1.4 cm central right upper lobe mass (series 6/image 36), suspicious for primary bronchogenic neoplasm.  Paraseptal emphysematous changes.  Mild dependent atelectasis in the bilateral lower lobes.  No pleural effusion or pneumothorax.  Enlarged, heterogeneous thyroid gland, right greater than left.  The heart is normal in size.  No pericardial effusion. Atherosclerotic calcifications of the aortic arch.  1.8 x 1.6 cm rounded, hyperdense lesion with peripheral calcifications in the right supraclavicular region (series 4/image 7), favored to be vascular rather than a supraclavicular node.  Mildly prominent mediastinal/right hilar lymph nodes, including: --8 mm short-axis right paratracheal node (series 4/image 15) --10 mm short axis precarinal node (series 4/image 29) --10 mm short-axis right hilar node (series 4/image 36) --8 mm short-axis right infrahilar/lower lobe node (series 4/image 45)  The visualized upper abdomen is unremarkable.  Degenerative changes of the visualized thoracolumbar spine.  IMPRESSION: No evidence of pulmonary embolism.  1.7 cm central right upper lobe mass, suspicious for primary bronchogenic neoplasm.  Consider PET CT for staging and/or biopsy (likely via bronchoscopy) for tissue  diagnosis.  Associated borderline enlarged mediastinal/right hilar lymph nodes, as described above.  1.8 cm rounded hyperdense right supraclavicular lesion, favored to be vascular in etiology rather than a supraclavicular node.  Enlarged, heterogeneous thyroid gland.  Correlate with thyroid ultrasound as clinically warranted.   Original Report Authenticated By: Charline Bills, M.D.      Date: 08/28/2012  Rate: 70  Rhythm: normal sinus rhythm  QRS Axis: normal  Intervals: normal  ST/T Wave abnormalities: normal  Conduction Disutrbances:none  Narrative Interpretation:   Old EKG Reviewed: unchanged   1. Lung mass       MDM   Patient with pleuritic type chest and back pain that started yesterday. She denies cough, fever, nausea or vomiting. She states she has some mild shortness of breath out of the ordinary. She does have a history of hypertension and diabetes however the patient states she had a catheterization in the last 3-4 years that was okay requiring stents. Symptoms today do not appear to be cardiac in nature. Her EKG is within normal limits. Lower concern for PE versus musculoskeletal chest pain. Also some mild epigastric pain which could be GERD. She has no symptoms suggestive of pancreatitis, hepatitis or cholecystitis. She did recently have a 4 hour car ride approximately a week ago for a funeral. However she denies any leg pain or swelling and no prior history of DVT. She does have a significant history of GERD and takes Prilosec and Carafate. However she denies any new food changes or other reasons for a GERD flare. Patient given 2 mg of morphine for pain. CBC, CMP, lipase, troponin, d-dimer, chest x-ray pending  1:18 PM Labs except for mild elevation in d-dimer.  On repeat evaluation pt has a cough and state she did have to use nebulizer last night without improvement in pain.  CXR shows possible nodule or neoplasm and recommending CT with contrast for further eval.  Unable to  get an IV but will attempt a few more times.  2:41 PM Pt found to have concerns for bronchogenic carcinoma.  No signs of PE.  Spoke with PCP and will f/u with her on Monday for further treatment.  Pt states she has hydrocodone at home.  Also patient's IV infiltrated and she has swelling to the left lower sternum and the. She was instructed to elevate and use warm compresses.    Gwyneth Sprout, MD 08/28/12 1442  Gwyneth Sprout, MD 08/28/12 1450

## 2012-08-28 NOTE — ED Notes (Signed)
Patient transported to X-ray 

## 2013-05-26 ENCOUNTER — Non-Acute Institutional Stay (SKILLED_NURSING_FACILITY): Payer: Medicare Other | Admitting: Internal Medicine

## 2013-05-26 DIAGNOSIS — I426 Alcoholic cardiomyopathy: Secondary | ICD-10-CM

## 2013-05-26 DIAGNOSIS — J449 Chronic obstructive pulmonary disease, unspecified: Secondary | ICD-10-CM

## 2013-05-26 DIAGNOSIS — E1059 Type 1 diabetes mellitus with other circulatory complications: Secondary | ICD-10-CM

## 2013-05-26 DIAGNOSIS — H547 Unspecified visual loss: Secondary | ICD-10-CM

## 2013-05-28 ENCOUNTER — Non-Acute Institutional Stay (SKILLED_NURSING_FACILITY): Payer: Medicare Other | Admitting: Internal Medicine

## 2013-05-28 DIAGNOSIS — I509 Heart failure, unspecified: Secondary | ICD-10-CM

## 2013-05-28 DIAGNOSIS — H543 Unqualified visual loss, both eyes: Secondary | ICD-10-CM

## 2013-05-28 DIAGNOSIS — N10 Acute tubulo-interstitial nephritis: Secondary | ICD-10-CM

## 2013-05-28 NOTE — Progress Notes (Signed)
Patient ID: Molly Morris, female   DOB: 07/16/1941, 72 y.o.   MRN: [id number] Facility; [facility]  History [facility]. She was status post cardiac arrest probably secondary to ventricular arrhythmia. She came here on September 5. She complained of the nurses about dysuria with cloudy urine and a strong odor. The urinalysis showed many bacteria 21-50 white cells. Urine culture is showing greater than 100,000 Escherichia coli which is quinolone and ampicillin resistant but otherwise with a wide range of possible sensitivities period. She is a type II diabetic. During my review of this patient she complained about bilateral visual loss. She states this started 7 days ago but variably says that this is started in the hospital and worse year. I note she was seen by our service 2 days ago and an ophthalmology consult was suggested. This has not been arranged.  She complains of a vague frontal headache but has no other particular features of this. She does not appear to have eye pain. She does not have a history of glaucoma although she was once given an eyedrop that she could not afford and therefore she didn't take it. It seems her eye care is done by Dr. Freida Busman who is an optometrist close by  Review of systems HEENT acute bilateral visual loss. Vague bifrontal pain Respiratory no shortness of breath Cardiac no clear chest pain  Physical exam; Gen. she is not in any distress but looks somewhat depressed HEENT; pupils are small with arcus senilis. I had difficulty getting a light response. She could identify a light held in front of her eyes but could not identify a finger count. Even with her glasses on she could not see the TV. There was no temporal artery tenderness. Cardiac heart sounds are normal no murmurs no signs of CHF Neurologic; cranial nerves are intact other than 2. She has antigravity strength bilaterally Abdomen; obese no liver no spleen  GU; no suprapubic but she does  have bilateral costovertebral angle tenderness left greater than right  Impression/plan #1 subacute Bilateral visual loss; I find this to be perplexing. We'll see if her optometrist can see her today at least that way we will know whether something within the intraocular structures explains her complaint. If her eye exam is normal then she will likely need CNS imaging, although I note she had a negative CT scan of the head in the hospital. She does not carry an obvious diagnosis of fatty ophthalmologic  condition although would wonder what ophthalmic drop she was unable to afford in the past. He is a diabetic on insulin. Finally she is on amiodarone and among the long list of possible side effects of this drug is optic neuritis. At this point in reviewing her records I am really uncertain whether the amiodarone is new or old nor do I really have any experience with this side effect of amiodarone. #2 ischemic cardiomyopathy. Her ejection fraction is 20-25% although I don't see any evidence of heart failure at the bedside. #3 cardiac arrest on amiodarone apparently has had a previous cardiac arrest and I really can't tell whether the amiodarone was started recently her she's been on this chronically #4 UTI Escherichia coli. This will need to be treated Septra should suffice

## 2013-06-01 ENCOUNTER — Non-Acute Institutional Stay (SKILLED_NURSING_FACILITY): Payer: Medicare Other | Admitting: Internal Medicine

## 2013-06-01 DIAGNOSIS — D638 Anemia in other chronic diseases classified elsewhere: Secondary | ICD-10-CM

## 2013-06-01 DIAGNOSIS — E1159 Type 2 diabetes mellitus with other circulatory complications: Secondary | ICD-10-CM

## 2013-06-22 DIAGNOSIS — E1059 Type 1 diabetes mellitus with other circulatory complications: Secondary | ICD-10-CM | POA: Insufficient documentation

## 2013-06-22 DIAGNOSIS — J4489 Other specified chronic obstructive pulmonary disease: Secondary | ICD-10-CM | POA: Insufficient documentation

## 2013-06-22 DIAGNOSIS — H547 Unspecified visual loss: Secondary | ICD-10-CM | POA: Insufficient documentation

## 2013-06-22 DIAGNOSIS — J449 Chronic obstructive pulmonary disease, unspecified: Secondary | ICD-10-CM | POA: Insufficient documentation

## 2013-06-22 DIAGNOSIS — I426 Alcoholic cardiomyopathy: Secondary | ICD-10-CM | POA: Insufficient documentation

## 2013-06-22 DIAGNOSIS — E1051 Type 1 diabetes mellitus with diabetic peripheral angiopathy without gangrene: Secondary | ICD-10-CM | POA: Insufficient documentation

## 2013-06-22 NOTE — Progress Notes (Signed)
Patient ID: Molly Morris, female   DOB: [birth year], 72 y.o.   MRN: [id number]        HISTORY & PHYSICAL  DATE: 05/26/2013   FACILITY: [facility]  LEVEL OF CARE: SNF (31)  ALLERGIES:   Lisinopril.    CHIEF COMPLAINT:  Manage cardiomyopathy, COPD, and diabetes mellitus.    HISTORY OF PRESENT ILLNESS:  The patient is a 72 year-old, African-American female who was hospitalized secondary to cardiac arrest.  After hospitalization, she is admitted to this facility for short-term rehabilitation.  She has the following problems:    CARDIOMYOPATHY: The patient's cardiomyopathy remains stable. Patient denies increasing lower extremity swelling, shortness of breath, chest pain, palpitations, orthopnea or PNDs. No complications reported from the medications currently being used.   EF of 25%.   It was deemed that the patient is not a candidate for AICD placement.     COPD: the COPD remains stable.  Pt denies sob, cough, wheezing or declining exercise tolerance.  No complications from the medications presently being used.    DM:pt's DM remains stable.  Pt denies polyuria, polydipsia, polyphagia, changes in vision or hypoglycemic episodes.  No complications noted from the medication presently being used.  Last hemoglobin A1c is:  Not available.     PAST MEDICAL HISTORY :  Past Medical History  Diagnosis Date  . Diabetes mellitus   . Hypertension   . GERD (gastroesophageal reflux disease)   . COPD (chronic obstructive pulmonary disease)    Cardiomyopathy.    History of right lung cancer, status post radiation treatment.      Tobacco abuse.    History of Takotsubo cardiomyopathy.    PAST SURGICAL HISTORY: Past Surgical History  Procedure Laterality Date  . Cesarean section     Colonoscopy.    Polypectomy.    Lung biopsy.    SOCIAL HISTORY:  reports that she has quit smoking. She does not have any smokeless tobacco history on file. She reports that she does not  drink alcohol or use illicit drugs.  FAMILY HISTORY:   Coronary artery disease.    CURRENT MEDICATIONS: Reviewed per [facility]  REVIEW OF SYSTEMS:  Difficult to obtain.  Patient is a poor historian.     PHYSICAL EXAMINATION  VS:  T 98.4       P 92      RR 19      BP 122/75      POX%        WT (Lb)  GENERAL: no acute distress, morbidly obese body habitus EYES: conjunctivae normal, sclerae normal, normal eye lids MOUTH/THROAT: lips without lesions,no lesions in the mouth,tongue is without lesions,uvula elevates in midline NECK: supple, trachea midline, no neck masses, no thyroid tenderness, no thyromegaly LYMPHATICS: no LAN in the neck, no supraclavicular LAN RESPIRATORY: breathing is even & unlabored, BS CTAB CARDIAC: RRR, no murmur,no extra heart sounds EDEMA/VARICOSITIES:  +5 bilateral lower extremity edema  ARTERIAL:  pedal pulses nonpalpable    GI:  ABDOMEN: abdomen soft, normal BS, no masses, no tenderness  LIVER/SPLEEN: no hepatomegaly, no splenomegaly MUSCULOSKELETAL: HEAD: normal to inspection & palpation BACK: no kyphosis, scoliosis or spinal processes tenderness EXTREMITIES: LEFT UPPER EXTREMITY: full range of motion, normal strength & tone RIGHT UPPER EXTREMITY:  full range of motion, normal strength & tone LEFT LOWER EXTREMITY:  full range of motion, normal strength & tone RIGHT LOWER EXTREMITY:  full range of motion, normal strength & tone PSYCHIATRIC: the patient is alert & oriented  to person, affect & behavior appropriate  LABS/RADIOLOGY: Chest x-ray showed improved pulmonary edema.    Serum magnesium level was 0.8.    BUN 18, creatinine 0.59.    CT of the head:  No acute findings.    White count 8.8, hemoglobin 12.1, platelets 222.    Troponin 0.12.    Sodium 133, potassium 4.4, chloride 93, CO2 21, glucose 352.    EKG did not show any acute findings.    ASSESSMENT/PLAN:  Cardiomyopathy.  Adequately compensated now.    COPD.  Well compensated.     Diabetes mellitus with vascular complications.  Continue current medications.    Visual problems.  Patient's family reports that patient is having difficulty with vision since hospitalization.  We will request an Ophthalmology consultation as soon as possible.    Peripheral neuropathy.  Continue gabapentin.    GERD.  Well controlled.     Check CBC and CMP.    I have reviewed patient's medical records received at admission/from hospitalization.  CPT CODE: 81191

## 2013-06-30 NOTE — Progress Notes (Signed)
Patient ID: Molly Morris, female   DOB: [birth year], 72 y.o.   MRN: [id number]        PROGRESS NOTE  DATE: 06/01/2013  FACILITY:  [facility]  LEVEL OF CARE: SNF (31)  Acute Visit  CHIEF COMPLAINT:  Manage diabetes mellitus and anemia of chronic disease.    HISTORY OF PRESENT ILLNESS: I was requested by the staff to assess the patient regarding above problem(s):  DM:pt's DM is unstable.  The patient's CBGs are greater than 200.  Pt denies polyuria, polydipsia, polyphagia, changes in vision or hypoglycemic episodes.  No complications noted from the medication presently being used.  Last hemoglobin A1c is:  Not available.    ANEMIA: The anemia has been stable. The patient denies fatigue, melena or hematochezia. The patient is currently not on iron.   On 06/01/2013:  Hemoglobin is 10.4, MCV 83.5.  On 05/28/2013:   Hemoglobin 10.    PAST MEDICAL HISTORY : Reviewed.  No changes.  CURRENT MEDICATIONS: Reviewed per [facility]  REVIEW OF SYSTEMS:  GENERAL: no change in appetite, no fatigue, no weight changes, no fever, chills or weakness RESPIRATORY: no cough, SOB, DOE,, wheezing, hemoptysis CARDIAC: no chest pain, edema or palpitations GI: no abdominal pain, diarrhea, constipation, heart burn, nausea or vomiting  PHYSICAL EXAMINATION  GENERAL: no acute distress, morbidly obese body habitus NECK: supple, trachea midline, no neck masses, no thyroid tenderness, no thyromegaly RESPIRATORY: breathing is even & unlabored, BS CTAB CARDIAC: RRR, no murmur,no extra heart sounds EDEMA/VARICOSITIES:  +1 bilateral lower extremity edema  ARTERIAL:  pedal pulses diminished   GI: abdomen soft, normal BS, no masses, no tenderness, no hepatomegaly, no splenomegaly PSYCHIATRIC: the patient is alert & oriented to person, affect & behavior appropriate  ASSESSMENT/PLAN:  Diabetes mellitus with vascular complications.  Uncontrolled problem.  Increase Levemir to 26 U q.h.s.  Anemia of  chronic disease.  Hemoglobin improved.     CPT CODE: 40981

## 2013-07-01 DIAGNOSIS — E1159 Type 2 diabetes mellitus with other circulatory complications: Secondary | ICD-10-CM | POA: Insufficient documentation

## 2013-07-01 DIAGNOSIS — D638 Anemia in other chronic diseases classified elsewhere: Secondary | ICD-10-CM | POA: Insufficient documentation

## 2014-03-08 ENCOUNTER — Other Ambulatory Visit: Payer: Self-pay | Admitting: Family

## 2015-10-06 ENCOUNTER — Emergency Department (HOSPITAL_BASED_OUTPATIENT_CLINIC_OR_DEPARTMENT_OTHER)
Admission: EM | Admit: 2015-10-06 | Discharge: 2015-10-06 | Disposition: A | Discharge status: Home / Self Care | Payer: Medicare HMO | Attending: Emergency Medicine | Admitting: Emergency Medicine

## 2015-10-06 ENCOUNTER — Emergency Department (HOSPITAL_BASED_OUTPATIENT_CLINIC_OR_DEPARTMENT_OTHER): Payer: Medicare HMO

## 2015-10-06 ENCOUNTER — Encounter (HOSPITAL_BASED_OUTPATIENT_CLINIC_OR_DEPARTMENT_OTHER): Payer: Self-pay | Admitting: Emergency Medicine

## 2015-10-06 DIAGNOSIS — R112 Nausea with vomiting, unspecified: Secondary | ICD-10-CM

## 2015-10-06 DIAGNOSIS — I10 Essential (primary) hypertension: Secondary | ICD-10-CM | POA: Insufficient documentation

## 2015-10-06 DIAGNOSIS — R109 Unspecified abdominal pain: Secondary | ICD-10-CM | POA: Insufficient documentation

## 2015-10-06 DIAGNOSIS — Z79899 Other long term (current) drug therapy: Secondary | ICD-10-CM | POA: Diagnosis not present

## 2015-10-06 DIAGNOSIS — Z87891 Personal history of nicotine dependence: Secondary | ICD-10-CM | POA: Insufficient documentation

## 2015-10-06 DIAGNOSIS — E119 Type 2 diabetes mellitus without complications: Secondary | ICD-10-CM | POA: Diagnosis not present

## 2015-10-06 DIAGNOSIS — Z8719 Personal history of other diseases of the digestive system: Secondary | ICD-10-CM | POA: Insufficient documentation

## 2015-10-06 DIAGNOSIS — J449 Chronic obstructive pulmonary disease, unspecified: Secondary | ICD-10-CM | POA: Diagnosis not present

## 2015-10-06 DIAGNOSIS — R1110 Vomiting, unspecified: Secondary | ICD-10-CM

## 2015-10-06 DIAGNOSIS — Z7984 Long term (current) use of oral hypoglycemic drugs: Secondary | ICD-10-CM | POA: Insufficient documentation

## 2015-10-06 DIAGNOSIS — E876 Hypokalemia: Secondary | ICD-10-CM | POA: Insufficient documentation

## 2015-10-06 LAB — URINE MICROSCOPIC-ADD ON

## 2015-10-06 LAB — CBC WITH DIFFERENTIAL/PLATELET
BASOS ABS: 0 10*3/uL (ref 0.0–0.1)
BASOS PCT: 0 %
EOS PCT: 2 %
Eosinophils Absolute: 0.1 10*3/uL (ref 0.0–0.7)
HEMATOCRIT: 34.2 % — AB (ref 36.0–46.0)
Hemoglobin: 11.4 g/dL — ABNORMAL LOW (ref 12.0–15.0)
Lymphocytes Relative: 27 %
Lymphs Abs: 1.4 10*3/uL (ref 0.7–4.0)
MCH: 28.4 pg (ref 26.0–34.0)
MCHC: 33.3 g/dL (ref 30.0–36.0)
MCV: 85.3 fL (ref 78.0–100.0)
MONO ABS: 0.7 10*3/uL (ref 0.1–1.0)
Monocytes Relative: 13 %
NEUTROS ABS: 3 10*3/uL (ref 1.7–7.7)
Neutrophils Relative %: 58 %
PLATELETS: 241 10*3/uL (ref 150–400)
RBC: 4.01 MIL/uL (ref 3.87–5.11)
RDW: 13.4 % (ref 11.5–15.5)
WBC: 5.2 10*3/uL (ref 4.0–10.5)

## 2015-10-06 LAB — COMPREHENSIVE METABOLIC PANEL
ALBUMIN: 4 g/dL (ref 3.5–5.0)
ALT: 21 U/L (ref 14–54)
AST: 25 U/L (ref 15–41)
Alkaline Phosphatase: 60 U/L (ref 38–126)
Anion gap: 12 (ref 5–15)
BUN: 20 mg/dL (ref 6–20)
CHLORIDE: 93 mmol/L — AB (ref 101–111)
CO2: 31 mmol/L (ref 22–32)
Calcium: 8.5 mg/dL — ABNORMAL LOW (ref 8.9–10.3)
Creatinine, Ser: 1.55 mg/dL — ABNORMAL HIGH (ref 0.44–1.00)
GFR calc Af Amer: 37 mL/min — ABNORMAL LOW (ref >60)
GFR calc non Af Amer: 32 mL/min — ABNORMAL LOW (ref >60)
GLUCOSE: 132 mg/dL — AB (ref 65–99)
POTASSIUM: 3 mmol/L — AB (ref 3.5–5.1)
Sodium: 136 mmol/L (ref 135–145)
Total Bilirubin: 0.4 mg/dL (ref 0.3–1.2)
Total Protein: 7.3 g/dL (ref 6.5–8.1)

## 2015-10-06 LAB — URINALYSIS, ROUTINE W REFLEX MICROSCOPIC
Bilirubin Urine: NEGATIVE
GLUCOSE, UA: NEGATIVE mg/dL
Hgb urine dipstick: NEGATIVE
KETONES UR: NEGATIVE mg/dL
NITRITE: POSITIVE — AB
PROTEIN: NEGATIVE mg/dL
Specific Gravity, Urine: 1.015 (ref 1.005–1.030)
pH: 5.5 (ref 5.0–8.0)

## 2015-10-06 LAB — LIPASE, BLOOD: Lipase: 20 U/L (ref 11–51)

## 2015-10-06 MED ORDER — POTASSIUM CHLORIDE CRYS ER 20 MEQ PO TBCR
40.0000 meq | EXTENDED_RELEASE_TABLET | Freq: Once | ORAL | Status: AC
  Administered 2015-10-06: 40 meq via ORAL
  Filled 2015-10-06: qty 2

## 2015-10-06 MED ORDER — POTASSIUM CHLORIDE 10 MEQ/100ML IV SOLN
10.0000 meq | INTRAVENOUS | Status: AC
  Administered 2015-10-06: 10 meq via INTRAVENOUS
  Filled 2015-10-06 (×2): qty 100

## 2015-10-06 MED ORDER — ONDANSETRON 4 MG PO TBDP: ORAL_TABLET | ORAL | Status: AC

## 2015-10-06 MED ORDER — SODIUM CHLORIDE 0.9 % IV BOLUS (SEPSIS)
500.0000 mL | Freq: Once | INTRAVENOUS | Status: AC
  Administered 2015-10-06: 500 mL via INTRAVENOUS

## 2015-10-06 MED ORDER — ONDANSETRON HCL 4 MG/2ML IJ SOLN
4.0000 mg | Freq: Once | INTRAMUSCULAR | Status: AC
  Administered 2015-10-06: 4 mg via INTRAVENOUS
  Filled 2015-10-06: qty 2

## 2015-10-06 NOTE — ED Notes (Signed)
2ND RN attempting IV via Ultrasound.

## 2015-10-06 NOTE — ED Notes (Signed)
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

## 2015-10-06 NOTE — ED Notes (Signed)
Pt unable to void in triage, cup provided to patient

## 2015-10-06 NOTE — ED Notes (Addendum)
Wrong chart

## 2015-10-06 NOTE — ED Notes (Signed)
Pt with diarrhea and vomiting x 2 weeks, states she was seen by pcp and given cholestramine and lomotil pt did not feel medication and presents today because symptoms have not improved

## 2015-10-06 NOTE — Discharge Instructions (Signed)
Nausea and Vomiting Nausea is a sick feeling that often comes before throwing up (vomiting). Vomiting is a reflex where stomach contents come out of your mouth. Vomiting can cause severe loss of body fluids (dehydration). Children and elderly adults can become dehydrated quickly, especially if they also have diarrhea. Nausea and vomiting are symptoms of a condition or disease. It is important to find the cause of your symptoms. CAUSES   Direct irritation of the stomach lining. This irritation can result from increased acid production (gastroesophageal reflux disease), infection, food poisoning, taking certain medicines (such as nonsteroidal anti-inflammatory drugs), alcohol use, or tobacco use.  Signals from the brain.These signals could be caused by a headache, heat exposure, an inner ear disturbance, increased pressure in the brain from injury, infection, a tumor, or a concussion, pain, emotional stimulus, or metabolic problems.  An obstruction in the gastrointestinal tract (bowel obstruction).  Illnesses such as diabetes, hepatitis, gallbladder problems, appendicitis, kidney problems, cancer, sepsis, atypical symptoms of a heart attack, or eating disorders.  Medical treatments such as chemotherapy and radiation.  Receiving medicine that makes you sleep (general anesthetic) during surgery. DIAGNOSIS Your caregiver may ask for tests to be done if the problems do not improve after a few days. Tests may also be done if symptoms are severe or if the reason for the nausea and vomiting is not clear. Tests may include:  Urine tests.  Blood tests.  Stool tests.  Cultures (to look for evidence of infection).  X-rays or other imaging studies. Test results can help your caregiver make decisions about treatment or the need for additional tests. TREATMENT You need to stay well hydrated. Drink frequently but in small amounts.You may wish to drink water, sports drinks, clear broth, or eat frozen  ice pops or gelatin dessert to help stay hydrated.When you eat, eating slowly may help prevent nausea.There are also some antinausea medicines that may help prevent nausea. HOME CARE INSTRUCTIONS   Take all medicine as directed by your caregiver.  If you do not have an appetite, do not force yourself to eat. However, you must continue to drink fluids.  If you have an appetite, eat a normal diet unless your caregiver tells you differently.  Eat a variety of complex carbohydrates (rice, wheat, potatoes, bread), lean meats, yogurt, fruits, and vegetables.  Avoid high-fat foods because they are more difficult to digest.  Drink enough water and fluids to keep your urine clear or pale yellow.  If you are dehydrated, ask your caregiver for specific rehydration instructions. Signs of dehydration may include:  Severe thirst.  Dry lips and mouth.  Dizziness.  Dark urine.  Decreasing urine frequency and amount.  Confusion.  Rapid breathing or pulse. SEEK IMMEDIATE MEDICAL CARE IF:   You have blood or brown flecks (like coffee grounds) in your vomit.  You have black or bloody stools.  You have a severe headache or stiff neck.  You are confused.  You have severe abdominal pain.  You have chest pain or trouble breathing.  You do not urinate at least once every 8 hours.  You develop cold or clammy skin.  You continue to vomit for longer than 24 to 48 hours.  You have a fever. MAKE SURE YOU:   Understand these instructions.  Will watch your condition.  Will get help right away if you are not doing well or get worse.   This information is not intended to replace advice given to you by your health care provider. Make sure  you discuss any questions you have with your health care provider.   Document Released: 09/02/2005 Document Revised: 11/25/2011 Document Reviewed: 01/30/2011 Elsevier Interactive Patient Education 2016 [employer].  Hypokalemia Hypokalemia means  that the amount of potassium in the blood is lower than normal.Potassium is a chemical, called an electrolyte, that helps regulate the amount of fluid in the body. It also stimulates muscle contraction and helps nerves function properly.Most of the body's potassium is inside of cells, and only a very small amount is in the blood. Because the amount in the blood is so small, minor changes can be life-threatening. CAUSES  Antibiotics.  Diarrhea or vomiting.  Using laxatives too much, which can cause diarrhea.  Chronic kidney disease.  Water pills (diuretics).  Eating disorders (bulimia).  Low magnesium level.  Sweating a lot. SIGNS AND SYMPTOMS  Weakness.  Constipation.  Fatigue.  Muscle cramps.  Mental confusion.  Skipped heartbeats or irregular heartbeat (palpitations).  Tingling or numbness. DIAGNOSIS  Your health care provider can diagnose hypokalemia with blood tests. In addition to checking your potassium level, your health care provider may also check other lab tests. TREATMENT Hypokalemia can be treated with potassium supplements taken by mouth or adjustments in your current medicines. If your potassium level is very low, you may need to get potassium through a vein (IV) and be monitored in the hospital. A diet high in potassium is also helpful. Foods high in potassium are:  Nuts, such as peanuts and pistachios.  Seeds, such as sunflower seeds and pumpkin seeds.  Peas, lentils, and lima beans.  Whole grain and bran cereals and breads.  Fresh fruit and vegetables, such as apricots, avocado, bananas, cantaloupe, kiwi, oranges, tomatoes, asparagus, and potatoes.  Orange and tomato juices.  Red meats.  Fruit yogurt. HOME CARE INSTRUCTIONS  Take all medicines as prescribed by your health care provider.  Maintain a healthy diet by including nutritious food, such as fruits, vegetables, nuts, whole grains, and lean meats.  If you are taking a laxative, be  sure to follow the directions on the label. SEEK MEDICAL CARE IF:  Your weakness gets worse.  You feel your heart pounding or racing.  You are vomiting or having diarrhea.  You are diabetic and having trouble keeping your blood glucose in the normal range. SEEK IMMEDIATE MEDICAL CARE IF:  You have chest pain, shortness of breath, or dizziness.  You are vomiting or having diarrhea for more than 2 days.  You faint. MAKE SURE YOU:   Understand these instructions.  Will watch your condition.  Will get help right away if you are not doing well or get worse.   This information is not intended to replace advice given to you by your health care provider. Make sure you discuss any questions you have with your health care provider.   Document Released: 09/02/2005 Document Revised: 09/23/2014 Document Reviewed: 03/05/2013 Elsevier Interactive Patient Education [employer].

## 2015-10-06 NOTE — ED Provider Notes (Signed)
CSN: [id number]     Arrival date & time 10/06/15  1033 History   First MD Initiated Contact with Patient 10/06/15 1300     Chief Complaint  Patient presents with  . Abdominal Pain     (Consider location/radiation/quality/duration/timing/severity/associated sxs/prior Treatment) HPI Patient presents with 2 weeks of vomiting. States it's worse after eating or drinking. She states it's worse in the last few days. She is unable to tolerate any solids and is only taking sips of liquids. Has decreased urine production. Last bowel movement was 2 days ago. Denies any abdominal pain or distention. States she saw her gastroenterologist 2 weeks ago who shot x-rays which were normal. She was also started on PPI but that has not improved her symptoms. No fever or chills. No blood in the vomit or stool. Past Medical History  Diagnosis Date  . Diabetes mellitus   . Hypertension   . GERD (gastroesophageal reflux disease)   . COPD (chronic obstructive pulmonary disease) [facility])    Past Surgical History  Procedure Laterality Date  . Cesarean section     History reviewed. No pertinent family history. Social History  Substance Use Topics  . Smoking status: Former Research scientist (life sciences)  . Smokeless tobacco: None  . Alcohol Use: No   OB History    No data available     Review of Systems  Constitutional: Positive for fatigue. Negative for fever and chills.  Respiratory: Negative for shortness of breath.   Cardiovascular: Negative for chest pain.  Gastrointestinal: Positive for nausea and vomiting. Negative for abdominal pain, diarrhea, constipation, blood in stool and abdominal distention.  Genitourinary: Negative for frequency, hematuria, flank pain and difficulty urinating.  Musculoskeletal: Negative for back pain, neck pain and neck stiffness.  Skin: Negative for rash and wound.  Neurological: Positive for weakness (generalized). Negative for dizziness, light-headedness, numbness and headaches.  All other  systems reviewed and are negative.     Allergies  Review of patient's allergies indicates no known allergies.  Home Medications   Prior to Admission medications   Medication Sig Start Date End Date Taking? Authorizing Provider  amiodarone (PACERONE) 200 MG tablet Take 200 mg by mouth daily.   Yes Historical Provider, MD  carvedilol (COREG) 25 MG tablet Take 25 mg by mouth 2 (two) times daily with a meal.   Yes Historical Provider, MD  fish oil-omega-3 fatty acids 1000 MG capsule Take 1 g by mouth daily.   Yes Historical Provider, MD  furosemide (LASIX) 20 MG tablet Take 20 mg by mouth.   Yes Historical Provider, MD  gabapentin (NEURONTIN) 300 MG capsule Take 300 mg by mouth 3 (three) times daily.   Yes Historical Provider, MD  glimepiride (AMARYL) 4 MG tablet Take 4 mg by mouth 2 (two) times daily.    Yes Historical Provider, MD  glipiZIDE (GLUCOTROL) 5 MG tablet Take by mouth daily before breakfast.   Yes Historical Provider, MD  promethazine (PHENERGAN) 25 MG tablet Take 25 mg by mouth every 6 (six) hours as needed for nausea or vomiting.   Yes Historical Provider, MD  saxagliptin HCl (ONGLYZA) 2.5 MG TABS tablet Take 2.5 mg by mouth daily.   Yes Historical Provider, MD  simvastatin (ZOCOR) 10 MG tablet Take 10 mg by mouth at bedtime.   Yes Historical Provider, MD  cholestyramine Lucrezia Starch) 4 GM/DOSE powder Take by mouth 3 (three) times daily with meals.    Historical Provider, MD  colesevelam (WELCHOL) 625 MG tablet Take by mouth 2 (two) times daily  with a meal.    Historical Provider, MD  ondansetron (ZOFRAN ODT) 4 MG disintegrating tablet 4mg  ODT q4 hours prn nausea/vomit 10/06/15   Julianne Rice, MD   BP 125/67 mmHg  Pulse 64  Temp(Src) 98.2 F (36.8 C) (Oral)  Resp 16  Ht 4\' 11"  (1.499 m)  Wt 185 lb (83.915 kg)  BMI 37.35 kg/m2  SpO2 97% Physical Exam  Constitutional: She is oriented to person, place, and time. She appears well-developed and well-nourished. No distress.   HENT:  Head: Normocephalic and atraumatic.  Mouth/Throat: No oropharyngeal exudate.  Dry lips  Eyes: EOM are normal. Pupils are equal, round, and reactive to light.  Neck: Normal range of motion. Neck supple. No JVD present.  Cardiovascular: Normal rate and regular rhythm.   Pulmonary/Chest: Effort normal and breath sounds normal. No respiratory distress. She has no wheezes. She has no rales.  Abdominal: Soft. She exhibits no distension and no mass. There is no tenderness. There is no rebound and no guarding.  Diminished bowel sounds throughout  Musculoskeletal: Normal range of motion. She exhibits no edema or tenderness.  Neurological: She is alert and oriented to person, place, and time.  Moves all extremities without focal deficit. Sensation is grossly intact.  Skin: Skin is warm and dry. No rash noted. No erythema.  Psychiatric: She has a normal mood and affect. Her behavior is normal.  Nursing note and vitals reviewed.   ED Course  Procedures (including critical care time) Labs Review Labs Reviewed  URINALYSIS, ROUTINE W REFLEX MICROSCOPIC (NOT AT [facility]) - Abnormal; Notable for the following:    APPearance CLOUDY (*)    Nitrite POSITIVE (*)    Leukocytes, UA TRACE (*)    All other components within normal limits  CBC WITH DIFFERENTIAL/PLATELET - Abnormal; Notable for the following:    Hemoglobin 11.4 (*)    HCT 34.2 (*)    All other components within normal limits  COMPREHENSIVE METABOLIC PANEL - Abnormal; Notable for the following:    Potassium 3.0 (*)    Chloride 93 (*)    Glucose, Bld 132 (*)    Creatinine, Ser 1.55 (*)    Calcium 8.5 (*)    GFR calc non Af Amer 32 (*)    GFR calc Af Amer 37 (*)    All other components within normal limits  URINE MICROSCOPIC-ADD ON - Abnormal; Notable for the following:    Squamous Epithelial / LPF 0-5 (*)    Bacteria, UA MANY (*)    All other components within normal limits  LIPASE, BLOOD    Imaging Review No results  found. I have personally reviewed and evaluated these images and lab results as part of my medical decision-making.   EKG Interpretation None      MDM   Final diagnoses:  Non-intractable vomiting with nausea, vomiting of unspecified type  Hypokalemia   CT without evidence of any acute abnormality. Mild hypokalemia replacement emergency department. Patient is tolerating oral intake. We'll discharge home and return precautions given.   Patient has bacteremia without white blood cell presents. Urine cultured and advised to follow-up with primary physician.  Julianne Rice, MD 10/08/15 2226

## 2016-03-25 IMAGING — CT CT ABD-PELV W/O CM
2 of 4 series · 16 of 46 positions shown, 18 images · non-contrast
Comparison: 05/22/2014

CLINICAL DATA: Vomiting

EXAM:
CT ABDOMEN AND PELVIS WITHOUT CONTRAST
TECHNIQUE: Multidetector CT imaging of the abdomen and pelvis was performed
following the standard protocol without IV contrast.

[Series 2: axial st · axial · 0.73mm/px · z∈[-392,-48]mm · 13 of 75 slices shown, 15 images]
[im 3/75  soft-tissue]
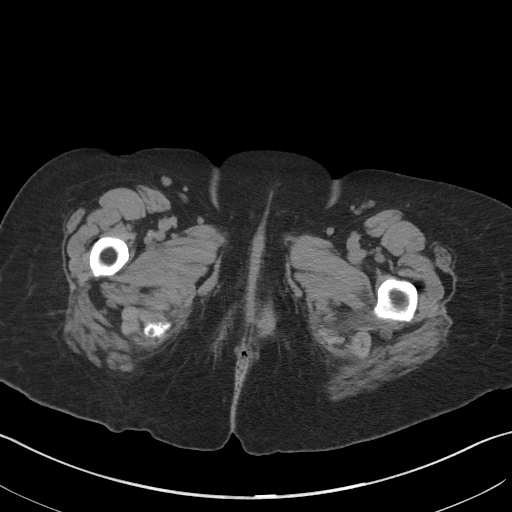
[im 3/75  bone]
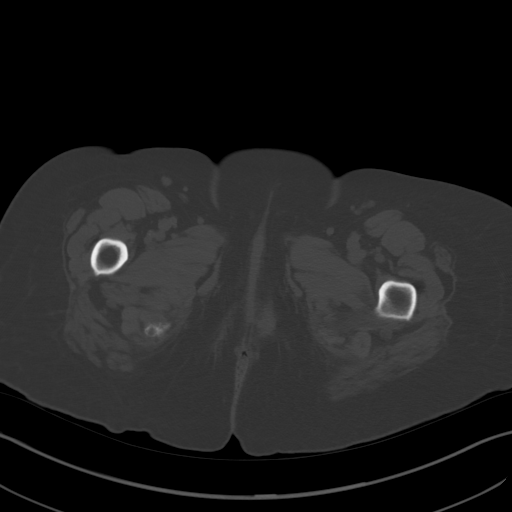
[im 9/75  soft-tissue]
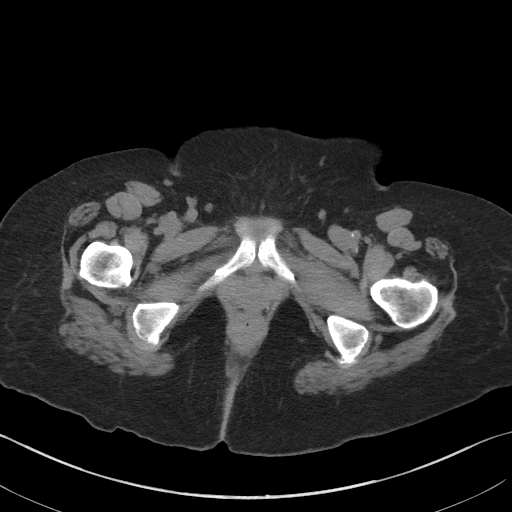
[im 15/75  soft-tissue]
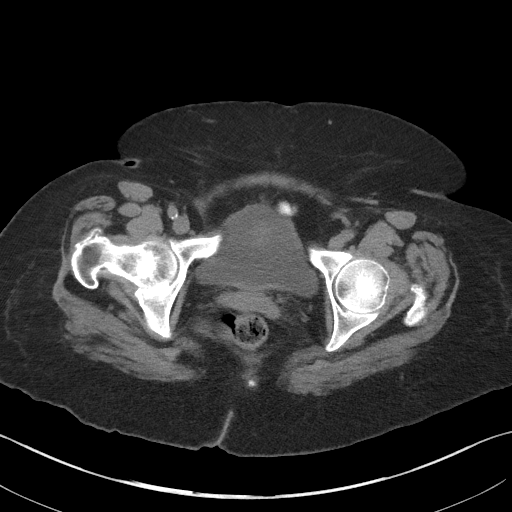
[im 21/75  soft-tissue]
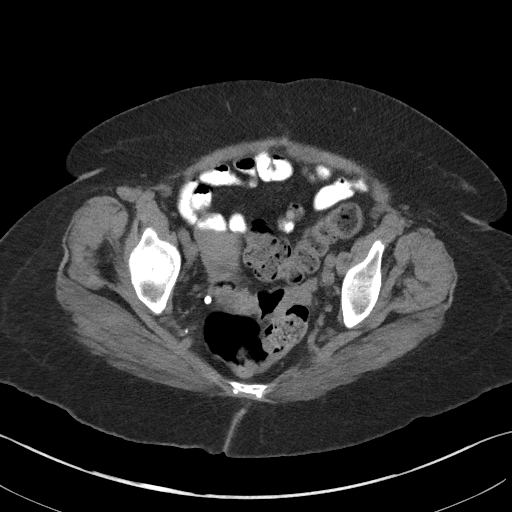
[im 27/75  soft-tissue]
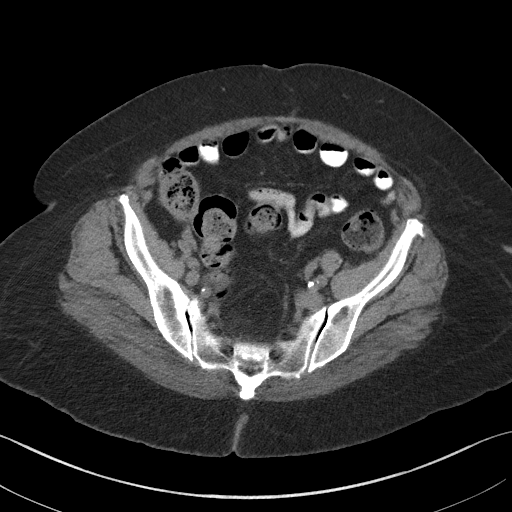
[im 33/75  soft-tissue]
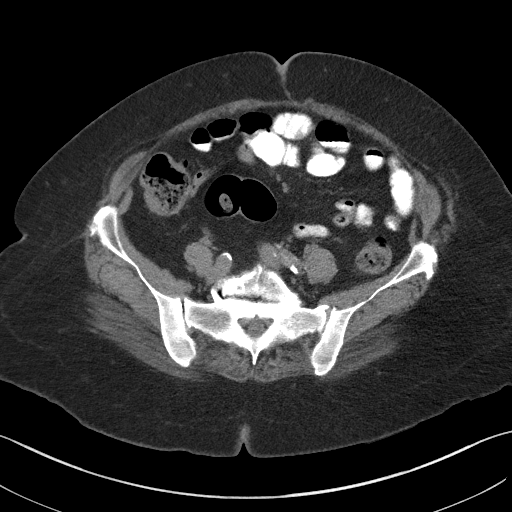
[im 39/75  soft-tissue]
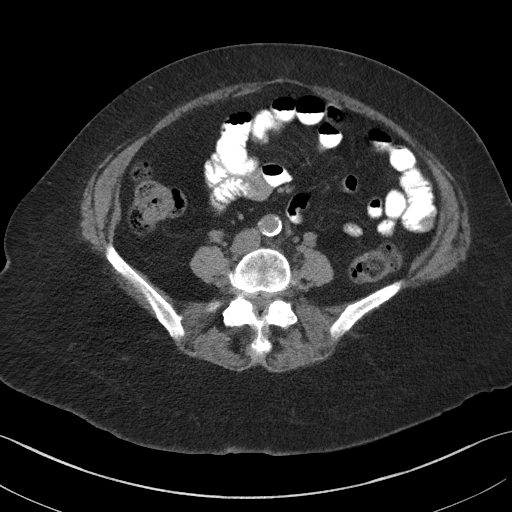
[im 42/75  soft-tissue]
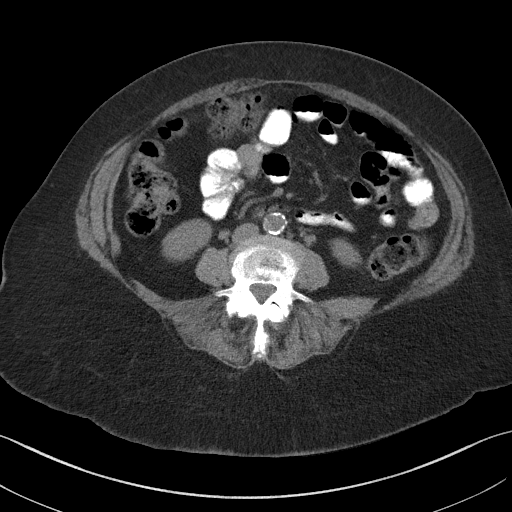
[im 48/75  soft-tissue]
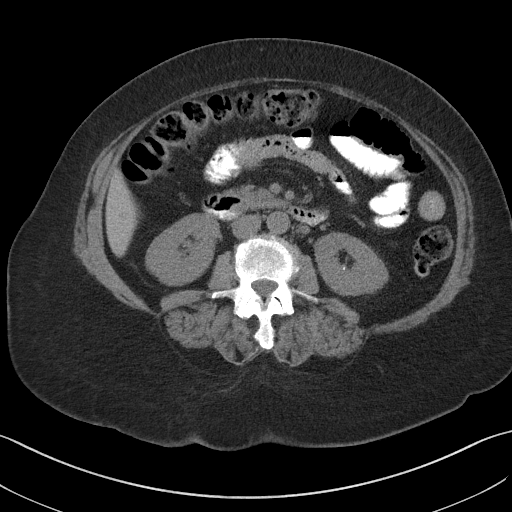
[im 48/75  bone]
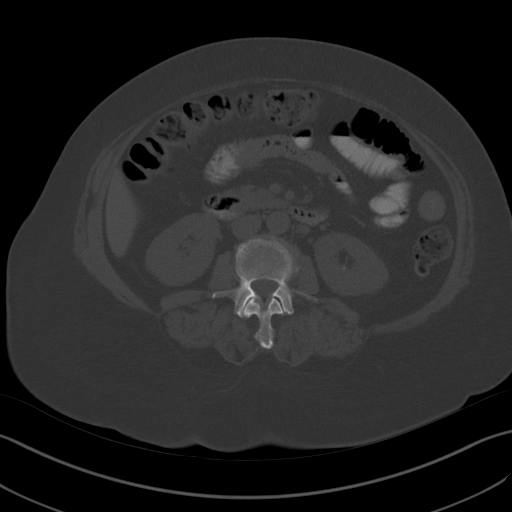
[im 54/75  soft-tissue]
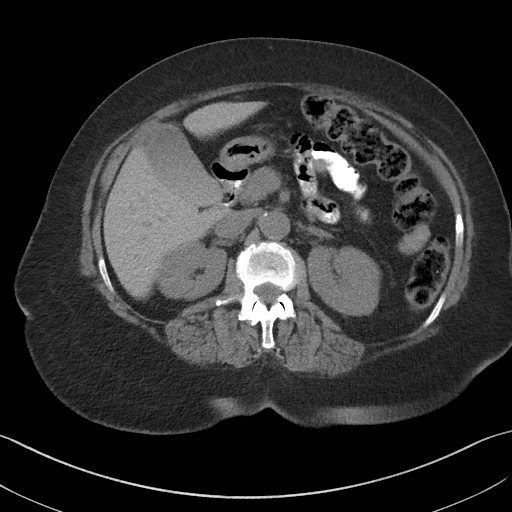
[im 60/75  soft-tissue]
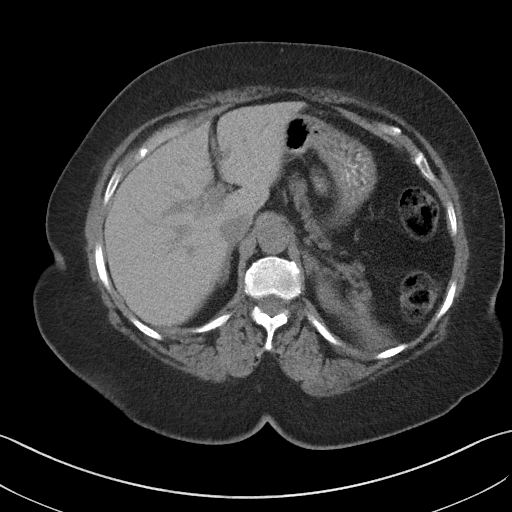
[im 66/75  soft-tissue]
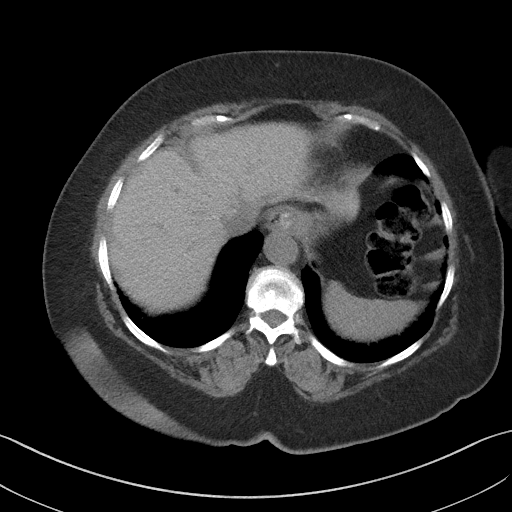
[im 72/75  soft-tissue]
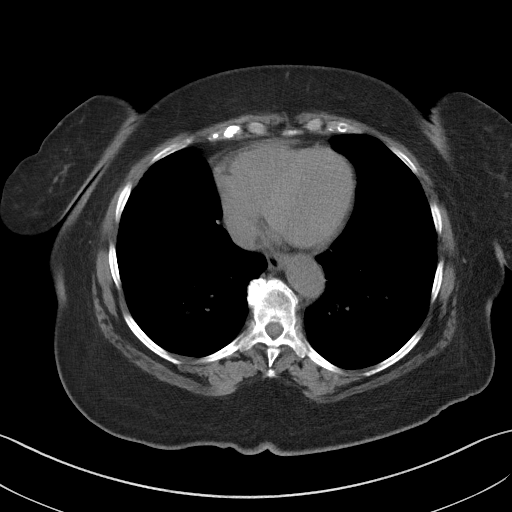

[Series 5: coronal st · coronal · 0.75mm/px · 3 of 94 slices shown]
[im 32/94  soft-tissue]
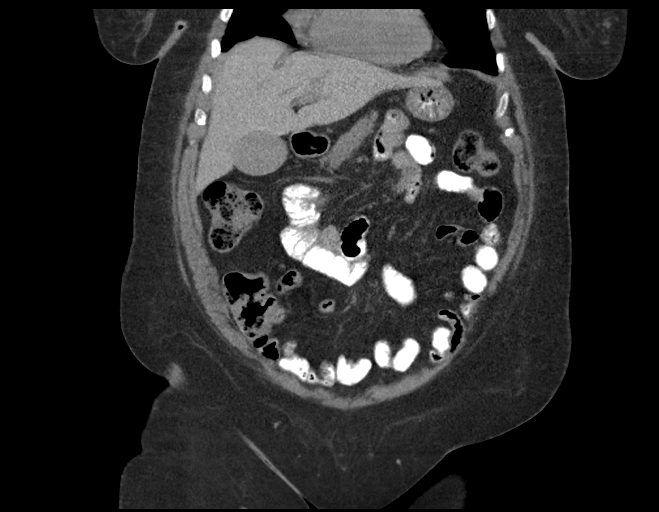
[im 42/94  soft-tissue]
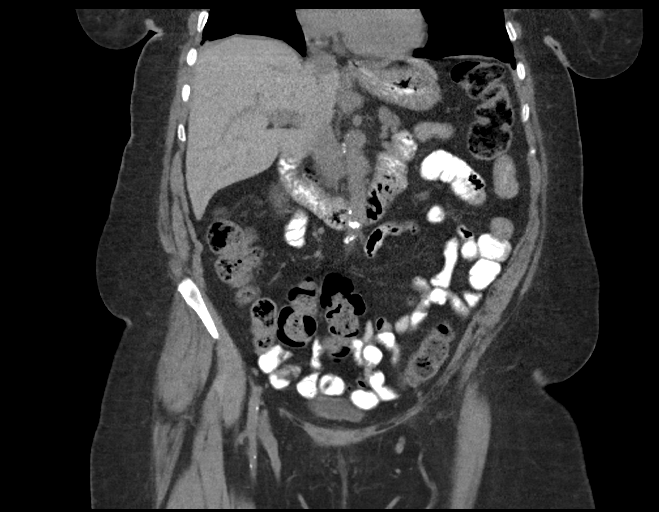
[im 52/94  soft-tissue]
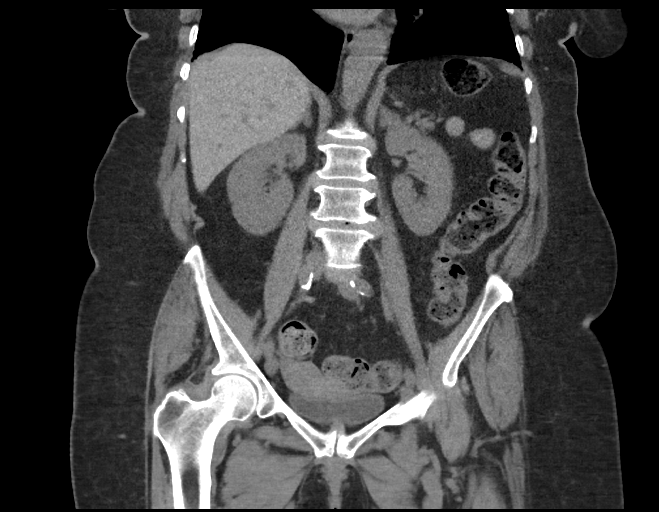

[16 of 46 positions shown; findings below may reference images not displayed]

FINDINGS: Lower chest and abdominal wall:  No contributory findings.

Hepatobiliary: No focal liver abnormality.Layering high-density in
the gallbladder without discrete calcified stone. No visible biliary
obstruction or inflammation.

Pancreas: Unremarkable.

Spleen: Unremarkable.

Adrenals/Urinary Tract: Negative adrenals. Punctate lower pole left
nephrolithiasis. Much if not all of right renal hilar calcification
is arterial and atherosclerotic. No hydronephrosis or ureteral
calculus Unremarkable bladder.

Reproductive:No pathologic findings.

Stomach/Bowel: No obstruction. No appendicitis. Formed stool
throughout the colon, also seen in 2597. No colonic over distension.

Vascular/Lymphatic: No acute vascular abnormality. No mass or
adenopathy.

Peritoneal: No ascites or pneumoperitoneum.

Musculoskeletal: Advanced and diffuse degenerative disc disease and
facet arthropathy. Advanced sacroiliac osteoarthritis. No acute
finding
IMPRESSION: No acute finding or change from 2597.

## 2016-12-21 ENCOUNTER — Encounter (HOSPITAL_BASED_OUTPATIENT_CLINIC_OR_DEPARTMENT_OTHER): Payer: Self-pay | Admitting: Emergency Medicine

## 2016-12-21 ENCOUNTER — Emergency Department (HOSPITAL_BASED_OUTPATIENT_CLINIC_OR_DEPARTMENT_OTHER)
Admission: EM | Admit: 2016-12-21 | Discharge: 2016-12-21 | Disposition: A | Discharge status: Home / Self Care | Payer: Medicare HMO | Attending: Emergency Medicine | Admitting: Emergency Medicine

## 2016-12-21 ENCOUNTER — Emergency Department (HOSPITAL_BASED_OUTPATIENT_CLINIC_OR_DEPARTMENT_OTHER): Payer: Medicare HMO

## 2016-12-21 DIAGNOSIS — Z79899 Other long term (current) drug therapy: Secondary | ICD-10-CM | POA: Diagnosis not present

## 2016-12-21 DIAGNOSIS — J208 Acute bronchitis due to other specified organisms: Secondary | ICD-10-CM | POA: Diagnosis not present

## 2016-12-21 DIAGNOSIS — J449 Chronic obstructive pulmonary disease, unspecified: Secondary | ICD-10-CM | POA: Diagnosis not present

## 2016-12-21 DIAGNOSIS — I10 Essential (primary) hypertension: Secondary | ICD-10-CM | POA: Diagnosis not present

## 2016-12-21 DIAGNOSIS — R05 Cough: Secondary | ICD-10-CM | POA: Diagnosis present

## 2016-12-21 DIAGNOSIS — E119 Type 2 diabetes mellitus without complications: Secondary | ICD-10-CM | POA: Insufficient documentation

## 2016-12-21 DIAGNOSIS — Z87891 Personal history of nicotine dependence: Secondary | ICD-10-CM | POA: Insufficient documentation

## 2016-12-21 DIAGNOSIS — Z85118 Personal history of other malignant neoplasm of bronchus and lung: Secondary | ICD-10-CM | POA: Insufficient documentation

## 2016-12-21 HISTORY — DX: Malignant (primary) neoplasm, unspecified: C80.1

## 2016-12-21 MED ORDER — BENZONATATE 100 MG PO CAPS: 100.0000 mg | ORAL_CAPSULE | Freq: Three times a day (TID) | ORAL | 0 refills | Status: AC

## 2016-12-21 MED ORDER — DOXYCYCLINE HYCLATE 100 MG PO CAPS: 100.0000 mg | ORAL_CAPSULE | Freq: Two times a day (BID) | ORAL | 0 refills | Status: AC

## 2016-12-21 NOTE — ED Notes (Addendum)
Pt states she has had a cough for several days. Unable to control bowels and bladder at times. Taking OTC meds for same. Productive cough with "dark" sputum, but pt unable to see. States she did a breathing treatment as well without relief.

## 2016-12-21 NOTE — ED Provider Notes (Signed)
[place] DEPT [facility] Provider Note   CSN: [id number] Arrival date & time: 12/21/16  1250  By signing my name below, I, Molly Morris, attest that this documentation has been prepared under the direction and in the presence of Malvin Johns, MD . Electronically Signed: Dolores Morris, Scribe. 12/21/2016. 3:47 PM.  History   Chief Complaint Chief Complaint  Patient presents with  . Cough   The history is provided by the patient and a relative. No language interpreter was used.    HPI Comments:  Molly Morris is a 76 y.o. female with pmhx of Lung CA, COPD, DM, HTN, Emphysema and GERD who presents to the Emergency Department complaining of frequent, worsening cough onset 3 days. Pt describes her cough as being productive of green sputum and sometimes so forceful that she loses control of her bladder. She reports associated mild SOB. Pt has tried robitussin and at-home breathing treatments with no relief. She denies any fever, CP, LE swelling, vomiting, diarrhea or any other symptoms.   Past Medical History:  Diagnosis Date  . Cancer ([place])    lung CA 2014  . COPD (chronic obstructive pulmonary disease) ([place])   . Diabetes mellitus   . GERD (gastroesophageal reflux disease)   . Hypertension     Patient Active Problem List   Diagnosis Date Noted  . Type II or unspecified type diabetes mellitus with peripheral circulatory disorders, uncontrolled(250.72) 07/01/2013  . Anemia of other chronic disease 07/01/2013  . 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 Chronic airway obstruction, not elsewhere classified 06/22/2013  . Type I (juvenile type) diabetes mellitus with peripheral circulatory disorders, not stated as uncontrolled(250.71) 06/22/2013  . Visual problems 06/22/2013    Past Surgical History:  Procedure Laterality Date  . CESAREAN SECTION      OB History    No data available       Home Medications    Prior to Admission medications   Medication Sig Start Date End Date  Taking? Authorizing Provider  amiodarone (PACERONE) 200 MG tablet Take 200 mg by mouth daily.   Yes Historical Provider, MD  amLODipine (NORVASC) 10 MG tablet Take 10 mg by mouth daily.   Yes Historical Provider, MD  atorvastatin (LIPITOR) 10 MG tablet Take 10 mg by mouth daily.   Yes Historical Provider, MD  furosemide (LASIX) 20 MG tablet Take 20 mg by mouth 2 (two) times daily.    Yes Historical Provider, MD  gabapentin (NEURONTIN) 300 MG capsule Take 300 mg by mouth 3 (three) times daily.   Yes Historical Provider, MD  omeprazole (PRILOSEC) 20 MG capsule Take 40 mg by mouth daily.   Yes Historical Provider, MD  saxagliptin HCl (ONGLYZA) 2.5 MG TABS tablet Take 5 mg by mouth daily.    Yes Historical Provider, MD  benzonatate (TESSALON) 100 MG capsule Take 1 capsule (100 mg total) by mouth every 8 (eight) hours. 12/21/16   Malvin Johns, MD  carvedilol (COREG) 25 MG tablet Take 25 mg by mouth 2 (two) times daily with a meal.    Historical Provider, MD  cholestyramine Lucrezia Starch) 4 GM/DOSE powder Take by mouth 3 (three) times daily with meals.    Historical Provider, MD  colesevelam (WELCHOL) 625 MG tablet Take by mouth 2 (two) times daily with a meal.    Historical Provider, MD  doxycycline (VIBRAMYCIN) 100 MG capsule Take 1 capsule (100 mg total) by mouth 2 (two) times daily. One po bid x 7 days 12/21/16   Malvin Johns, MD  fish  oil-omega-3 fatty acids 1000 MG capsule Take 1 g by mouth daily.    Historical Provider, MD  glimepiride (AMARYL) 4 MG tablet Take 4 mg by mouth 2 (two) times daily.     Historical Provider, MD  glipiZIDE (GLUCOTROL) 5 MG tablet Take by mouth daily before breakfast.    Historical Provider, MD  ondansetron (ZOFRAN ODT) 4 MG disintegrating tablet 4mg  ODT q4 hours prn nausea/vomit 10/06/15   Julianne Rice, MD  promethazine (PHENERGAN) 25 MG tablet Take 25 mg by mouth every 6 (six) hours as needed for nausea or vomiting.    Historical Provider, MD  simvastatin (ZOCOR) 10 MG  tablet Take 10 mg by mouth at bedtime.    Historical Provider, MD    Family History No family history on file.  Social History Social History  Substance Use Topics  . Smoking status: Former Research scientist (life sciences)  . Smokeless tobacco: Never Used  . Alcohol use No     Allergies   Patient has no known allergies.   Review of Systems Review of Systems  Constitutional: Negative for chills, diaphoresis, fatigue and fever.  HENT: Negative for congestion, rhinorrhea and sneezing.   Eyes: Negative.   Respiratory: Positive for cough and shortness of breath. Negative for chest tightness.   Cardiovascular: Negative for chest pain and leg swelling.  Gastrointestinal: Negative for abdominal pain, blood in stool, diarrhea, nausea and vomiting.  Genitourinary: Negative for difficulty urinating, flank pain, frequency and hematuria.  Musculoskeletal: Negative for arthralgias and back pain.  Skin: Negative for rash.  Neurological: Negative for dizziness, speech difficulty, weakness, numbness and headaches.     Physical Exam Updated Vital Signs BP (!) 160/70 (BP Location: Right Arm)   Pulse 90   Temp 98.4 F (36.9 C) (Oral)   Resp 20   Ht 4\' 11"  (1.499 m)   Wt 173 lb (78.5 kg)   SpO2 98%   BMI 34.94 kg/m   Physical Exam  Constitutional: She is oriented to person, place, and time. She appears well-developed and well-nourished.  HENT:  Head: Normocephalic and atraumatic.  Eyes: Pupils are equal, round, and reactive to light.  Neck: Normal range of motion. Neck supple.  Cardiovascular: Normal rate, regular rhythm and normal heart sounds.   Pulmonary/Chest: Effort normal and breath sounds normal. No respiratory distress. She has no wheezes. She has no rales. She exhibits no tenderness.  Abdominal: Soft. Bowel sounds are normal. There is no tenderness. There is no rebound and no guarding.  Musculoskeletal: Normal range of motion. She exhibits no edema.  Lymphadenopathy:    She has no cervical  adenopathy.  Neurological: She is alert and oriented to person, place, and time. No cranial nerve deficit.  Skin: Skin is warm and dry. No rash noted.  Psychiatric: She has a normal mood and affect.     ED Treatments / Results  DIAGNOSTIC STUDIES:  Oxygen Saturation is 98% on RA, normal by my interpretation.    COORDINATION OF CARE:  3:51 PM Discussed treatment plan with pt at bedside which includes OTC symptomatic management and pt agreed to plan.  Labs (all labs ordered are listed, but only abnormal results are displayed) Labs Reviewed - No data to display  EKG  EKG Interpretation None       Radiology Dg Chest 2 View  Result Date: 12/21/2016 CLINICAL DATA:  76 year old female with a history of productive cough. Treated for prior right-sided lung cancer. EXAM: CHEST  2 VIEW COMPARISON:  PET-CT 05/14/2016, chest x-ray 12/01/2015 FINDINGS:  Cardiomediastinal silhouette unchanged. Linear opacity of the right upper lobe extending from the hilum, unchanged. Linear opacity of the left greater than right lung bases. No pneumothorax. No pleural effusion. No confluent airspace disease. No displaced fracture IMPRESSION: Similar appearance of treatment changes and background of chronic lung changes without evidence of superimposed acute cardiopulmonary disease. Electronically Signed   By: Corrie Mckusick D.O.   On: 12/21/2016 13:52    Procedures Procedures (including critical care time)  Medications Ordered in ED Medications - No data to display   Initial Impression / Assessment and Plan / ED Course  I have reviewed the triage vital signs and the nursing notes.  Pertinent labs & imaging results that were available during my care of the patient were reviewed by me and considered in my medical decision making (see chart for details).    PT presents with worsening cough with change in sputum.  No fever or SOB.  No hypoxia, no pneumonia.  Given hx of COPD, will start doxycycline and  tessalon.  Advised to use mucinex as well.  Advised to follow-up with her PCP. Return precautions were given.   Final Clinical Impressions(s) / ED Diagnoses   Final diagnoses:  Acute bronchitis due to other specified organisms    New Prescriptions New Prescriptions   BENZONATATE (TESSALON) 100 MG CAPSULE    Take 1 capsule (100 mg total) by mouth every 8 (eight) hours.   DOXYCYCLINE (VIBRAMYCIN) 100 MG CAPSULE    Take 1 capsule (100 mg total) by mouth 2 (two) times daily. One po bid x 7 days  I personally performed the services described in this documentation, which was scribed in my presence.  The recorded information has been reviewed and considered.      Malvin Johns, MD 12/21/16 [phone]

## 2016-12-21 NOTE — ED Triage Notes (Signed)
Pt c/o productive cough for past 3-4 days "so bad I can't control my bladder."  Pt denies known fever.  States sore throat 2 days ago that has since "mostly" resolved.  Pt reports weight loss of 20-30lbs in past 1.5 months and hisotry of lung CA dx 4 years ago - tx for which received in [place].

## 2016-12-21 NOTE — ED Notes (Signed)
Pt and family given d/c instructions as per chart. Rx x 2. Verbalizes understanding. No questions. 

## 2017-06-10 IMAGING — DX DG CHEST 2V
2 series · 2 of 2 positions shown · non-contrast
Comparison: PET-CT 05/14/2016, chest x-ray 12/01/2015

CLINICAL DATA: 75-year-old female with a history of productive
cough.

Treated for prior right-sided lung cancer.
EXAM:
CHEST  2 VIEW

[chest pa]
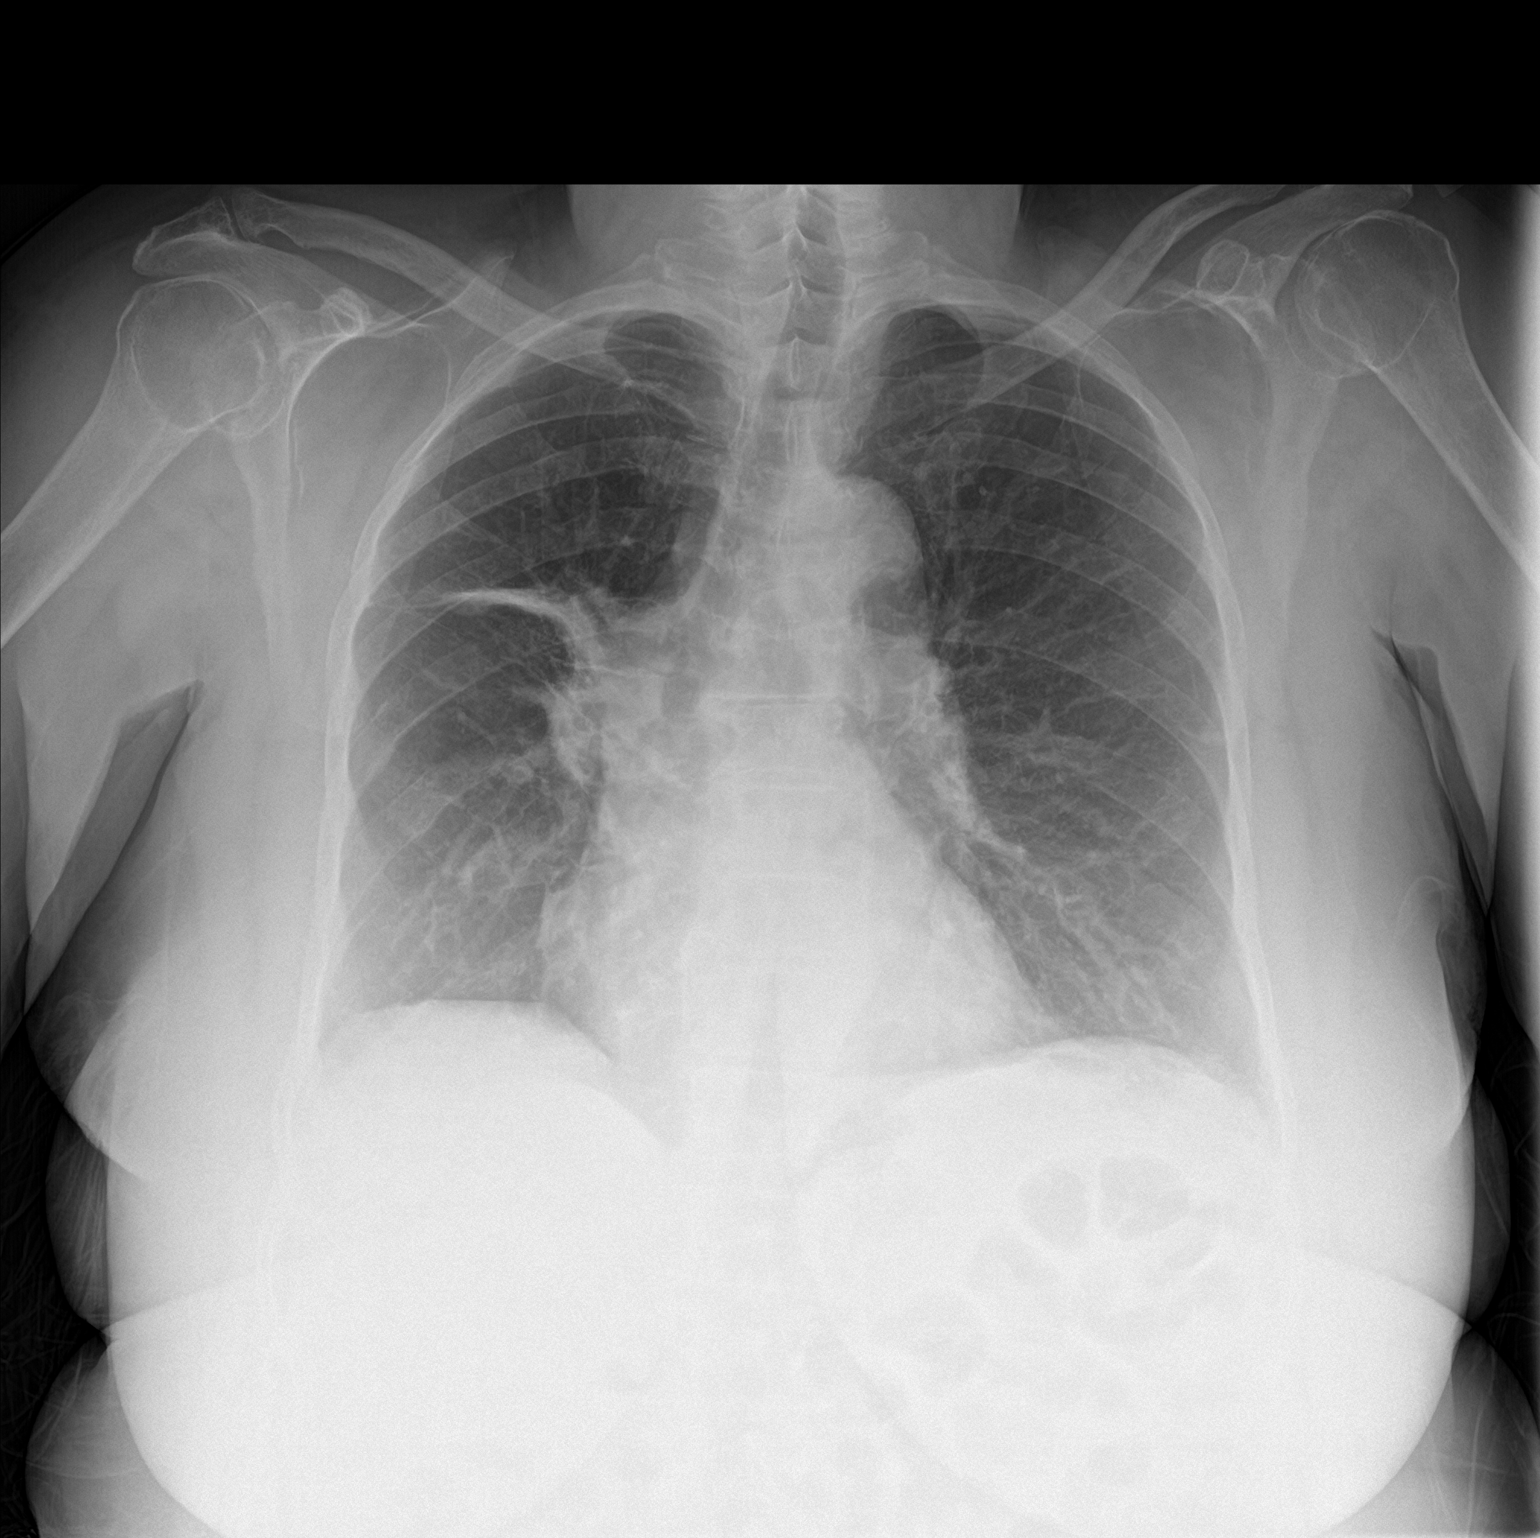

[chest lat]
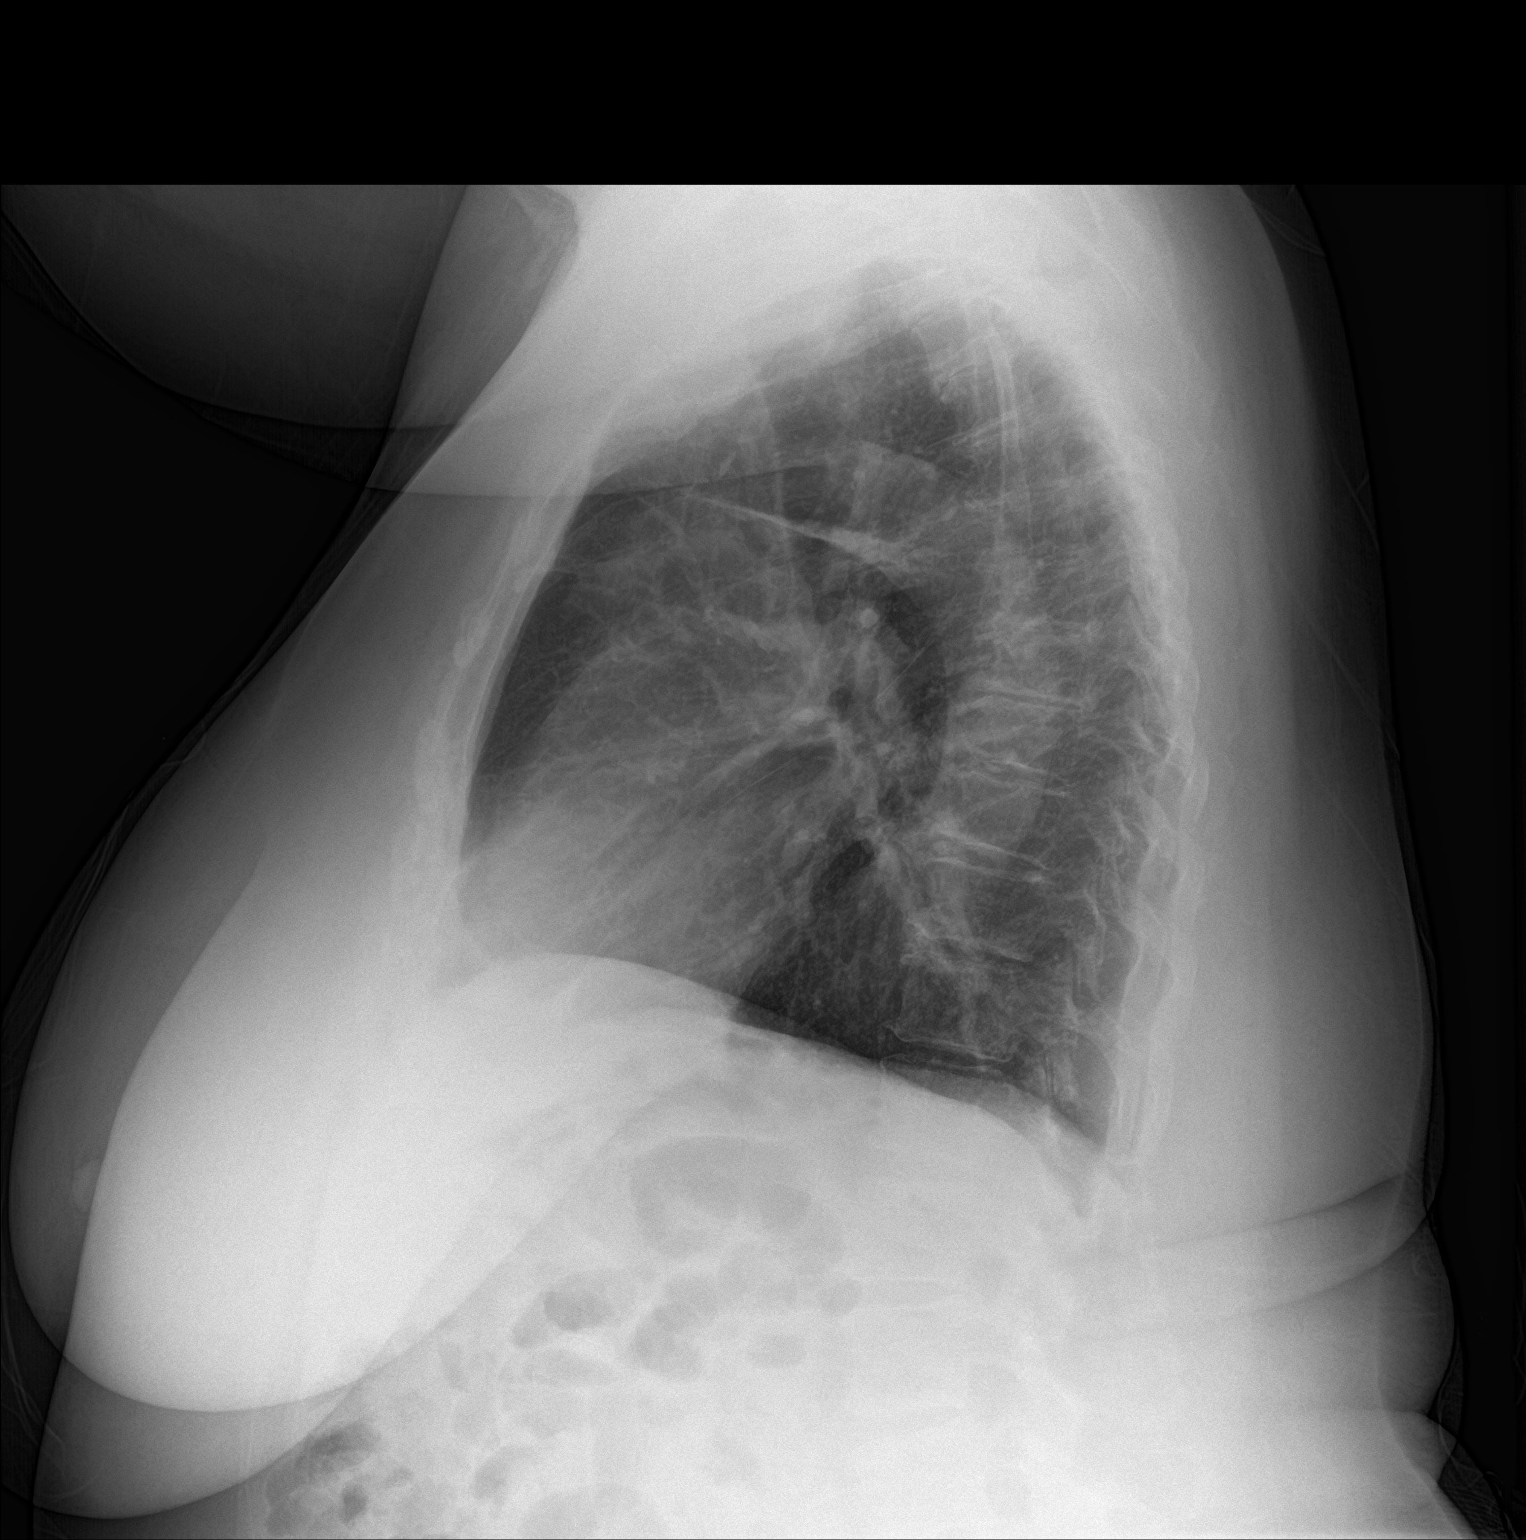

[2 of 2 positions shown; findings below may reference images not displayed]

FINDINGS: Cardiomediastinal silhouette unchanged. Linear opacity of the right
upper lobe extending from the hilum, unchanged. Linear opacity of
the left greater than right lung bases.

No pneumothorax. No pleural effusion. No confluent airspace disease.

No displaced fracture
IMPRESSION: Similar appearance of treatment changes and background of chronic
lung changes without evidence of superimposed acute cardiopulmonary
disease.

## 2024-01-01 ENCOUNTER — Emergency Department (HOSPITAL_BASED_OUTPATIENT_CLINIC_OR_DEPARTMENT_OTHER)
Admission: EM | Admit: 2024-01-01 | Discharge: 2024-01-01 | Disposition: A | Discharge status: Home / Self Care | Attending: Emergency Medicine | Admitting: Emergency Medicine

## 2024-01-01 ENCOUNTER — Encounter (HOSPITAL_BASED_OUTPATIENT_CLINIC_OR_DEPARTMENT_OTHER): Payer: Self-pay

## 2024-01-01 ENCOUNTER — Emergency Department (HOSPITAL_BASED_OUTPATIENT_CLINIC_OR_DEPARTMENT_OTHER)

## 2024-01-01 ENCOUNTER — Other Ambulatory Visit: Payer: Self-pay

## 2024-01-01 DIAGNOSIS — E1165 Type 2 diabetes mellitus with hyperglycemia: Secondary | ICD-10-CM | POA: Diagnosis not present

## 2024-01-01 DIAGNOSIS — N3000 Acute cystitis without hematuria: Secondary | ICD-10-CM | POA: Insufficient documentation

## 2024-01-01 DIAGNOSIS — R110 Nausea: Secondary | ICD-10-CM | POA: Diagnosis present

## 2024-01-01 DIAGNOSIS — Z7984 Long term (current) use of oral hypoglycemic drugs: Secondary | ICD-10-CM | POA: Insufficient documentation

## 2024-01-01 LAB — URINALYSIS, ROUTINE W REFLEX MICROSCOPIC
Bilirubin Urine: NEGATIVE
Glucose, UA: NEGATIVE mg/dL
Ketones, ur: NEGATIVE mg/dL
Nitrite: POSITIVE — AB
Protein, ur: 100 mg/dL — AB
Specific Gravity, Urine: 1.025 (ref 1.005–1.030)
pH: 5.5 (ref 5.0–8.0)

## 2024-01-01 LAB — BASIC METABOLIC PANEL WITH GFR
Anion gap: 11 (ref 5–15)
BUN: 24 mg/dL — ABNORMAL HIGH (ref 8–23)
CO2: 22 mmol/L (ref 22–32)
Calcium: 8.9 mg/dL (ref 8.9–10.3)
Chloride: 99 mmol/L (ref 98–111)
Creatinine, Ser: 1.08 mg/dL — ABNORMAL HIGH (ref 0.44–1.00)
GFR, Estimated: 51 mL/min — ABNORMAL LOW (ref >60)
Glucose, Bld: 290 mg/dL — ABNORMAL HIGH (ref 70–99)
Potassium: 4.2 mmol/L (ref 3.5–5.1)
Sodium: 132 mmol/L — ABNORMAL LOW (ref 135–145)

## 2024-01-01 LAB — URINALYSIS, MICROSCOPIC (REFLEX): WBC, UA: >50 WBC/hpf (ref 0–5)

## 2024-01-01 LAB — CBC
HCT: 33.2 % — ABNORMAL LOW (ref 36.0–46.0)
Hemoglobin: 11.1 g/dL — ABNORMAL LOW (ref 12.0–15.0)
MCH: 27.6 pg (ref 26.0–34.0)
MCHC: 33.4 g/dL (ref 30.0–36.0)
MCV: 82.6 fL (ref 80.0–100.0)
Platelets: 275 10*3/uL (ref 150–400)
RBC: 4.02 MIL/uL (ref 3.87–5.11)
RDW: 14.5 % (ref 11.5–15.5)
WBC: 19.8 10*3/uL — ABNORMAL HIGH (ref 4.0–10.5)
nRBC: 0 % (ref 0.0–0.2)

## 2024-01-01 LAB — CBG MONITORING, ED
Glucose-Capillary: 282 mg/dL — ABNORMAL HIGH (ref 70–99)
Glucose-Capillary: 218 mg/dL — ABNORMAL HIGH (ref 70–99)

## 2024-01-01 LAB — BETA-HYDROXYBUTYRIC ACID: Beta-Hydroxybutyric Acid: 0.2 mmol/L (ref 0.05–0.27)

## 2024-01-01 LAB — LACTIC ACID, PLASMA
Lactic Acid, Venous: 2.1 mmol/L (ref 0.5–1.9)
Lactic Acid, Venous: 1.8 mmol/L (ref 0.5–1.9)

## 2024-01-01 MED ORDER — SODIUM CHLORIDE 0.9 % IV BOLUS
1000.0000 mL | Freq: Once | INTRAVENOUS | Status: AC
  Administered 2024-01-01: 1000 mL via INTRAVENOUS

## 2024-01-01 MED ORDER — ONDANSETRON HCL 4 MG/2ML IJ SOLN
4.0000 mg | Freq: Once | INTRAMUSCULAR | Status: AC
  Administered 2024-01-01: 4 mg via INTRAVENOUS
  Filled 2024-01-01: qty 2

## 2024-01-01 MED ORDER — IOHEXOL 300 MG/ML  SOLN
100.0000 mL | Freq: Once | INTRAMUSCULAR | Status: AC | PRN
  Administered 2024-01-01: 80 mL via INTRAVENOUS

## 2024-01-01 MED ORDER — SODIUM CHLORIDE 0.9 % IV SOLN
1.0000 g | Freq: Once | INTRAVENOUS | Status: AC
  Administered 2024-01-01: 1 g via INTRAVENOUS
  Filled 2024-01-01: qty 10

## 2024-01-01 MED ORDER — CEPHALEXIN 500 MG PO CAPS: 1000.0000 mg | ORAL_CAPSULE | Freq: Two times a day (BID) | ORAL | 0 refills | Status: AC

## 2024-01-01 NOTE — ED Notes (Addendum)
 Pt alert and oriented X 4 at the time of discharge. RR even and unlabored. No acute distress noted. Pt verbalized understanding of discharge instructions as discussed. Pt transported to lobby in wheelchair at time of discharge.

## 2024-01-01 NOTE — ED Provider Notes (Signed)
 [place] [facility] Provider Note   CSN: [id number] Arrival date & time: 01/01/24  1138     History Chief Complaint  Patient presents with   Hyperglycemia    Molly Morris is a 83 y.o. female.  Patient with past history significant for type 2 diabetes presents to the emergency department today with concerns of hyperglycemia.  She states that she had blood glucose levels checked by her home health nurse this morning showing glucose at 270.  Patient was advised to come to the emergency department.  Patient reports that she last took her dose of metformin yesterday.  Has endorsed some nausea and some dry heaving but denies any productive vomiting.  Denies any abdominal discomfort or tenderness at this time.   Hyperglycemia Associated symptoms: nausea        Home Medications Prior to Admission medications   Medication Sig Start Date End Date Taking? Authorizing Provider  cephALEXin  (KEFLEX ) 500 MG capsule Take 2 capsules (1,000 mg total) by mouth 2 (two) times daily for 7 days. 01/01/24 01/08/24 Yes Queen Abbett A, PA-C  amiodarone (PACERONE) 200 MG tablet Take 200 mg by mouth daily.    [provider]  amLODipine (NORVASC) 10 MG tablet Take 10 mg by mouth daily.    [provider]  atorvastatin (LIPITOR) 10 MG tablet Take 10 mg by mouth daily.    [provider]  benzonatate  (TESSALON ) 100 MG capsule Take 1 capsule (100 mg total) by mouth every 8 (eight) hours. 12/21/16   Hershel Los, MD  carvedilol (COREG) 25 MG tablet Take 25 mg by mouth 2 (two) times daily with a meal.    [provider]  cholestyramine (QUESTRAN) 4 GM/DOSE powder Take by mouth 3 (three) times daily with meals.    [provider]  colesevelam (WELCHOL) 625 MG tablet Take by mouth 2 (two) times daily with a meal.    [provider]  doxycycline  (VIBRAMYCIN ) 100 MG capsule Take 1 capsule (100 mg total) by mouth 2 (two) times  daily. One po bid x 7 days 12/21/16   Hershel Los, MD  fish oil-omega-3 fatty acids 1000 MG capsule Take 1 g by mouth daily.    [provider]  furosemide (LASIX) 20 MG tablet Take 20 mg by mouth 2 (two) times daily.     [provider]  gabapentin (NEURONTIN) 300 MG capsule Take 300 mg by mouth 3 (three) times daily.    [provider]  glimepiride (AMARYL) 4 MG tablet Take 4 mg by mouth 2 (two) times daily.     [provider]  glipiZIDE (GLUCOTROL) 5 MG tablet Take by mouth daily before breakfast.    [provider]  omeprazole (PRILOSEC) 20 MG capsule Take 40 mg by mouth daily.    [provider]  ondansetron  (ZOFRAN  ODT) 4 MG disintegrating tablet 4mg  ODT q4 hours prn nausea/vomit 10/06/15   Evone Hoh, MD  promethazine (PHENERGAN) 25 MG tablet Take 25 mg by mouth every 6 (six) hours as needed for nausea or vomiting.    [provider]  saxagliptin HCl (ONGLYZA) 2.5 MG TABS tablet Take 5 mg by mouth daily.     [provider]  simvastatin (ZOCOR) 10 MG tablet Take 10 mg by mouth at bedtime.    [provider]      Allergies    Ace inhibitors    Review of Systems   Review of Systems  Gastrointestinal:  Positive  for nausea.  All other systems reviewed and are negative.   Physical Exam Updated Vital Signs BP (!) 123/57   Pulse 98   Temp 98.4 F (36.9 C) (Oral)   Resp (!) 26   Wt 77.1 kg   SpO2 97%   BMI 34.34 kg/m  Physical Exam Vitals and nursing note reviewed.  Constitutional:      General: She is not in acute distress.    Appearance: Normal appearance. She is well-developed. She is not ill-appearing.  HENT:     Head: Normocephalic and atraumatic.  Eyes:     Conjunctiva/sclera: Conjunctivae normal.  Cardiovascular:     Rate and Rhythm: Normal rate and regular rhythm.     Heart sounds: No murmur heard. Pulmonary:     Effort: Pulmonary effort is normal. No respiratory distress.      Breath sounds: Normal breath sounds.  Abdominal:     General: Abdomen is flat. Bowel sounds are normal. There is no distension.     Palpations: Abdomen is soft.     Tenderness: There is no abdominal tenderness. There is no guarding.  Musculoskeletal:        General: No swelling.     Cervical back: Neck supple.  Skin:    General: Skin is warm and dry.     Capillary Refill: Capillary refill takes less than 2 seconds.  Neurological:     Mental Status: She is alert.  Psychiatric:        Mood and Affect: Mood normal.     ED Results / Procedures / Treatments   Labs (all labs ordered are listed, but only abnormal results are displayed) Labs Reviewed  BASIC METABOLIC PANEL WITH GFR - Abnormal; Notable for the following components:      Result Value   Sodium 132 (*)    Glucose, Bld 290 (*)    BUN 24 (*)    Creatinine, Ser 1.08 (*)    GFR, Estimated 51 (*)    All other components within normal limits  CBC - Abnormal; Notable for the following components:   WBC 19.8 (*)    Hemoglobin 11.1 (*)    HCT 33.2 (*)    All other components within normal limits  URINALYSIS, ROUTINE W REFLEX MICROSCOPIC - Abnormal; Notable for the following components:   APPearance CLOUDY (*)    Hgb urine dipstick TRACE (*)    Protein, ur 100 (*)    Nitrite POSITIVE (*)    Leukocytes,Ua MODERATE (*)    All other components within normal limits  URINALYSIS, MICROSCOPIC (REFLEX) - Abnormal; Notable for the following components:   Bacteria, UA MANY (*)    All other components within normal limits  LACTIC ACID, PLASMA - Abnormal; Notable for the following components:   Lactic Acid, Venous 2.1 (*)    All other components within normal limits  CBG MONITORING, ED - Abnormal; Notable for the following components:   Glucose-Capillary 282 (*)    All other components within normal limits  CBG MONITORING, ED - Abnormal; Notable for the following components:   Glucose-Capillary 218 (*)    All other components  within normal limits  URINE CULTURE  BETA-HYDROXYBUTYRIC ACID  LACTIC ACID, PLASMA  CBG MONITORING, ED    EKG EKG Interpretation Date/Time:  Thursday January 01 2024 12:25:24 EDT Ventricular Rate:  97 PR Interval:  238 QRS Duration:  90 QT Interval:  358 QTC Calculation: 455 R Axis:   39  Text Interpretation: Sinus rhythm Ventricular bigeminy Prolonged  PR interval Abnormal R-wave progression, early transition Confirmed by Abby Hocking [phone]) on 01/01/2024 12:30:41 PM  Radiology CT ABDOMEN PELVIS W CONTRAST Result Date: 01/01/2024 CLINICAL DATA:  Abdominal pain, hyperglycemia EXAM: CT ABDOMEN AND PELVIS WITH CONTRAST TECHNIQUE: Multidetector CT imaging of the abdomen and pelvis was performed using the standard protocol following bolus administration of intravenous contrast. RADIATION DOSE REDUCTION: This exam was performed according to the departmental dose-optimization program which includes automated exposure control, adjustment of the mA and/or kV according to patient size and/or use of iterative reconstruction technique. CONTRAST:  80mL OMNIPAQUE  IOHEXOL  300 MG/ML  SOLN COMPARISON:  October 06, 2015 FINDINGS: Lower chest: No focal airspace consolidation or pleural effusion.A few small regions of subpleural cystic change in the right middle lobe, the medial right lower lobe, and the anterobasal left lower lobe (for example, axial image 19). Calcified granuloma in the right lower lobe. Hepatobiliary: No mass.No radiopaque stones or wall thickening of the gallbladder.No intrahepatic or extrahepatic biliary ductal dilation. Pancreas: No mass or main ductal dilation.No peripancreatic inflammation or fluid collection. Spleen: Normal size. No mass. Adrenals/Urinary Tract: No adrenal masses. 1 cm cyst in the left interpolar region. Intermediate density 1.1 cm lesion in the lower pole of the right kidney (axial 37). No hydronephrosis. Excreted contrast within the both collecting systems would obscure  nonobstructive calculi. Hypodense polypoid region along the left lateral bladder wall (axial 61), measuring 1.9 cm. Stomach/Bowel: The stomach is decompressed without focal abnormality. 2 cm periampullary duodenal diverticulum. No small bowel wall thickening or inflammation. No small bowel obstruction. Normal appendix. Scattered total colonic diverticulosis. No changes of acute diverticulitis. Vascular/Lymphatic: No aortic aneurysm. Diffuse aortoiliac atherosclerosis. No intraabdominal or pelvic lymphadenopathy. Reproductive: Age-related atrophy of the uterus and ovaries. No concerning adnexal mass.No free pelvic fluid. Other: No pneumoperitoneum, ascites, or mesenteric inflammation. Musculoskeletal: No acute fracture or destructive lesion.Multilevel degenerative disc disease of the spine. Osteopenia. IMPRESSION: 1.  No acute intra-abdominal or pelvic abnormality. 2. Along the left lateral bladder wall, there is a low-density polypoid lesion measuring 1.9 cm (axial 61). Given the low density, this could represent a polypoid lipoma, but on prior studies it was not well visualized due to the lack of contrast in the urinary bladder. Nonemergent urology follow-up recommended. 3. 1.1 cm hypodense lesion intermediate density lesion in the lower pole of the right kidney, which does not have the imaging characteristics of a benign cyst. While this could represent a proteinaceous or hemorrhagic cyst, a nonemergent multiphase abdominal MRI with IV contrast is recommended to exclude developing neoplasm. 4. Duodenal and scattered colonic diverticulosis. No changes of acute diverticulitis. Electronically Signed   By: Rance Burrows M.D.   On: 01/01/2024 16:30    Procedures Procedures    Medications Ordered in ED Medications  sodium chloride  0.9 % bolus 1,000 mL (0 mLs Intravenous Stopped 01/01/24 1523)  iohexol  (OMNIPAQUE ) 300 MG/ML solution 100 mL (80 mLs Intravenous Contrast Given 01/01/24 1400)  ondansetron  (ZOFRAN )  injection 4 mg (4 mg Intravenous Given 01/01/24 1543)  cefTRIAXone  (ROCEPHIN ) 1 g in sodium chloride  0.9 % 100 mL IVPB (0 g Intravenous Stopped 01/01/24 1843)    ED Course/ Medical Decision Making/ A&P                                Medical Decision Making Amount and/or Complexity of Data Reviewed Labs: ordered. Radiology: ordered.  Risk Prescription drug management.   This patient presents to the  ED for concern of hyperglycemia.  Differential diagnosis includes dehydration, AKI, DKA, UTI, bowel obstruction   Lab Tests:  I Ordered, and personally interpreted labs.  The pertinent results include: CBC shows leukocytosis at 19.8, UA consistent with infection with nitrites, leukocytes, bacteria seen, BMP shows mild dehydration with hyponatremia at 132, GFR down to 51, lactic acid slightly elevated at 2.1, CBG on repeat after fluids shows glucose decreased to 218, urine culture collected and pending, beta hydroxybutyric acid pending.   Imaging Studies ordered:  I ordered imaging studies including CT abdomen pelvis I independently visualized and interpreted imaging which showed 1.  No acute intra-abdominal or pelvic abnormality. 2. Along the left lateral bladder wall, there is a low-density polypoid lesion measuring 1.9 cm (axial 61). Given the low density, this could represent a polypoid lipoma, but on prior studies it was not well visualized due to the lack of contrast in the urinary bladder. Nonemergent urology follow-up recommended. 3. 1.1 cm hypodense lesion intermediate density lesion in the lower pole of the right kidney, which does not have the imaging characteristics of a benign cyst. While this could represent a proteinaceous or hemorrhagic cyst, a nonemergent multiphase abdominal MRI with IV contrast is recommended to exclude developing neoplasm. 4. Duodenal and scattered colonic diverticulosis. No changes of acute diverticulitis. I agree with the radiologist  interpretation   Medicines ordered and prescription drug management:  I ordered medication including Zofran , fluids, Rocephin  for nausea, dehydration, UTI Reevaluation of the patient after these medicines showed that the patient improved I have reviewed the patients home medicines and have made adjustments as needed   Problem List / ED Course:  Patient presents the emergency department today with concerns of hyperglycemia.  Patient had a notable glucose level at 270 at home and was advised by her home health nurse to come to the emergency department for evaluation.  Patient states that she typically has glucose levels running at 150.  States that she has had nausea since yesterday with 1 episode of vomiting and multiple episodes of dry heaving.  Denies any recent fever, chills, or bodyaches.  Denies any focal abdominal tenderness. Physical exam is largely reassuring.  Reassuring abdominal exam with no focal tenderness or signs of distention.  Bowel sounds present.  Normal heart and lung sounds.  Will proceed with workup for unclear cause of patient's hyperglycemia concern for possible DKA although patient is otherwise well-appearing. Workup appears to be consistent with UTI likely causing patient's leukocytosis and elevated lactic acid level.  Patient is not septic appearing as she is afebrile with no significant hypotension or tachypnea.  Patient however still remaining tachycardic even after fluid administration.  EKG shows no evidence of atrial fibrillation or flutter. Discussed findings with patient.  She does report to me that she has had some pain with urination over the last several days.  Unclear exactly when symptoms started.  Will initiate with a gram of Rocephin  for treatment of UTI.  Had discussion about possible admission given abnormal labs and workup versus outpatient follow-up and discharged with p.o. antibiotics.  Patient does reside with her daughter at home and daughter would prefer  to have her discharged home for continued therapy at home.  Given patient is not altered, I do not feel that this is an unreasonable request although I did caution the patient could have decline at home and worsening symptoms that would necessitate return to the emergency department and ultimate admission.  Patient and daughter verbalized understanding.  Will administer  a dose of Rocephin  and reassess shortly to ensure the patient remained stable for outpatient follow-up. Repeat lactic acid level downtrending from 2.1-1.8.  I do suspect this is likely secondary to patient's dehydration.  Patient otherwise stable at this time for outpatient follow-up and discharged home with a prescription for Keflex .   Final Clinical Impression(s) / ED Diagnoses Final diagnoses:  Acute cystitis without hematuria  Hyperglycemia due to diabetes mellitus [facility])    Rx / DC Orders ED Discharge Orders          Ordered    cephALEXin  (KEFLEX ) 500 MG capsule  2 times daily        01/01/24 1906              Ameliarose Shark A, PA-C 01/01/24 1908    Long, Joshua G, MD 01/02/24 [phone]

## 2024-01-01 NOTE — ED Notes (Signed)
 Attempted to obtain IV access to right hand per request, unsuccessful, tol well

## 2024-01-01 NOTE — ED Notes (Signed)
  CBG 218  

## 2024-01-01 NOTE — ED Triage Notes (Signed)
 Pt presents with complaint of hyperglycemia. Home nurse reported blood sugar 270 and was concerned advised to come to ED.last took metformin yesterday. Pt reports nausea and vomiting since yesterday. Denies pain at time

## 2024-01-01 NOTE — Discharge Instructions (Signed)
 You are seen in the emergency department today for concerns of elevated blood glucose levels.  Your sugar level was elevated which is possibly due to not taking her metformin today as you were feeling unwell starting last night.  Your urine however does have some concerns for infection present.  This likely does explain some of your current symptoms.  You were given a round of IV antibiotics here in the emergency department to jumpstart on treating this UTI.  I have sent a prescription for Keflex for the next 7 days.  Please take this as prescribed.  For any concerns of new or worsening symptoms, return to the emergency department.

## 2024-01-04 LAB — URINE CULTURE
Culture: >=100000 — AB
Special Requests: NORMAL

## 2024-01-05 ENCOUNTER — Telehealth (HOSPITAL_BASED_OUTPATIENT_CLINIC_OR_DEPARTMENT_OTHER): Payer: Self-pay

## 2024-01-05 NOTE — Telephone Encounter (Signed)
 Post ED Visit - Positive Culture Follow-up  Culture report reviewed by antimicrobial stewardship pharmacist: [facility] [x]  Mohammed Andrew, Pharm.D. []  Skeet Duke, Pharm.D., BCPS AQ-ID []  Leslee Rase, Pharm.D., BCPS []  Garland Junk, [address].D., BCPS []  [place], [address].D., BCPS, AAHIVP []  Alcide Aly, Pharm.D., BCPS, AAHIVP []  Jerri Morale, PharmD, BCPS []  Graham Laws, PharmD, BCPS []  Cleda Curly, PharmD, BCPS []  Tamar Fairly, PharmD []  Ballard Levels, PharmD, BCPS []  Ollen Beverage, PharmD  [facility] []  Arlyne Bering, PharmD []  Sherryle Don, PharmD []  Van Gelinas, PharmD []  Delila Felty, Rph []  [place]) Cleora Daft, PharmD []  Augustina Block, PharmD []  Arie Kurtz, PharmD []  Sharlyn Deaner, PharmD []  Agnes Hose, PharmD []  Kendall Pauls, PharmD []  Gladstone Lamer, PharmD []  Armanda Bern, PharmD []  Tera Fellows, PharmD   Positive urine culture Treated with Cephalexin , organism sensitive to the same and no further patient follow-up is required at this time.  Delena Feil 01/05/2024, 10:02 AM
